# Patient Record
Sex: Female | Born: 1950 | Race: Black or African American | Hispanic: No | Marital: Married | Smoking: Never smoker
Health system: Southern US, Community
[De-identification: ages and names within clinical notes are randomized; demographics above are authoritative.]

## PROBLEM LIST (undated history)

## (undated) DIAGNOSIS — R609 Edema, unspecified: Secondary | ICD-10-CM

## (undated) DIAGNOSIS — E559 Vitamin D deficiency, unspecified: Secondary | ICD-10-CM

## (undated) DIAGNOSIS — R0600 Dyspnea, unspecified: Secondary | ICD-10-CM

## (undated) DIAGNOSIS — N189 Chronic kidney disease, unspecified: Secondary | ICD-10-CM

## (undated) DIAGNOSIS — J45909 Unspecified asthma, uncomplicated: Secondary | ICD-10-CM

## (undated) DIAGNOSIS — M329 Systemic lupus erythematosus, unspecified: Secondary | ICD-10-CM

## (undated) DIAGNOSIS — T7840XA Allergy, unspecified, initial encounter: Secondary | ICD-10-CM

## (undated) DIAGNOSIS — I10 Essential (primary) hypertension: Secondary | ICD-10-CM

## (undated) DIAGNOSIS — R7303 Prediabetes: Secondary | ICD-10-CM

## (undated) DIAGNOSIS — L509 Urticaria, unspecified: Secondary | ICD-10-CM

## (undated) DIAGNOSIS — E785 Hyperlipidemia, unspecified: Secondary | ICD-10-CM

## (undated) DIAGNOSIS — L309 Dermatitis, unspecified: Secondary | ICD-10-CM

## (undated) HISTORY — DX: Systemic lupus erythematosus, unspecified: M32.9

## (undated) HISTORY — DX: Unspecified asthma, uncomplicated: J45.909

## (undated) HISTORY — DX: Essential (primary) hypertension: I10

## (undated) HISTORY — DX: Vitamin D deficiency, unspecified: E55.9

## (undated) HISTORY — DX: Edema, unspecified: R60.9

## (undated) HISTORY — DX: Hyperlipidemia, unspecified: E78.5

## (undated) HISTORY — DX: Dermatitis, unspecified: L30.9

## (undated) HISTORY — DX: Allergy, unspecified, initial encounter: T78.40XA

## (undated) HISTORY — DX: Urticaria, unspecified: L50.9

## (undated) HISTORY — PX: ESOPHAGOGASTRODUODENOSCOPY: SHX1529

## (undated) HISTORY — PX: COLONOSCOPY: SHX174

---

## 1999-04-07 ENCOUNTER — Encounter: Payer: Self-pay | Admitting: Family Medicine

## 1999-04-07 ENCOUNTER — Ambulatory Visit (HOSPITAL_COMMUNITY): Admission: RE | Admit: 1999-04-07 | Discharge: 1999-04-07 | Payer: Self-pay | Admitting: Family Medicine

## 2000-07-09 ENCOUNTER — Inpatient Hospital Stay (HOSPITAL_COMMUNITY): Admission: AD | Admit: 2000-07-09 | Discharge: 2000-07-11 | Payer: Self-pay | Admitting: Internal Medicine

## 2000-08-16 ENCOUNTER — Ambulatory Visit (HOSPITAL_COMMUNITY): Admission: RE | Admit: 2000-08-16 | Discharge: 2000-08-16 | Payer: Self-pay | Admitting: *Deleted

## 2000-08-16 ENCOUNTER — Encounter: Payer: Self-pay | Admitting: Internal Medicine

## 2001-06-13 ENCOUNTER — Encounter: Admission: RE | Admit: 2001-06-13 | Discharge: 2001-06-13 | Payer: Self-pay | Admitting: Internal Medicine

## 2001-06-13 ENCOUNTER — Encounter: Payer: Self-pay | Admitting: Internal Medicine

## 2001-06-25 ENCOUNTER — Encounter: Admission: RE | Admit: 2001-06-25 | Discharge: 2001-07-25 | Payer: Self-pay | Admitting: Internal Medicine

## 2001-07-23 ENCOUNTER — Other Ambulatory Visit: Admission: RE | Admit: 2001-07-23 | Discharge: 2001-07-23 | Payer: Self-pay | Admitting: Gynecology

## 2002-02-25 ENCOUNTER — Other Ambulatory Visit: Admission: RE | Admit: 2002-02-25 | Discharge: 2002-02-25 | Payer: Self-pay | Admitting: Gynecology

## 2003-01-04 ENCOUNTER — Ambulatory Visit (HOSPITAL_BASED_OUTPATIENT_CLINIC_OR_DEPARTMENT_OTHER): Admission: RE | Admit: 2003-01-04 | Discharge: 2003-01-04 | Payer: Self-pay | Admitting: *Deleted

## 2003-01-20 ENCOUNTER — Encounter: Admission: RE | Admit: 2003-01-20 | Discharge: 2003-04-20 | Payer: Self-pay | Admitting: Neurology

## 2003-05-25 ENCOUNTER — Encounter: Admission: RE | Admit: 2003-05-25 | Discharge: 2003-06-22 | Payer: Self-pay | Admitting: Neurology

## 2003-07-12 ENCOUNTER — Encounter: Admission: RE | Admit: 2003-07-12 | Discharge: 2003-07-12 | Payer: Self-pay | Admitting: Neurology

## 2003-07-12 ENCOUNTER — Encounter: Payer: Self-pay | Admitting: Neurology

## 2003-07-29 ENCOUNTER — Encounter: Admission: RE | Admit: 2003-07-29 | Discharge: 2003-08-31 | Payer: Self-pay | Admitting: Neurology

## 2005-04-02 ENCOUNTER — Inpatient Hospital Stay (HOSPITAL_COMMUNITY): Admission: AD | Admit: 2005-04-02 | Discharge: 2005-04-05 | Payer: Self-pay | Admitting: Internal Medicine

## 2007-07-16 ENCOUNTER — Encounter: Admission: RE | Admit: 2007-07-16 | Discharge: 2007-07-16 | Payer: Self-pay | Admitting: Internal Medicine

## 2011-01-09 ENCOUNTER — Ambulatory Visit: Payer: Self-pay | Admitting: Genetic Counselor

## 2011-04-13 ENCOUNTER — Encounter: Payer: Self-pay | Admitting: Internal Medicine

## 2011-05-18 NOTE — Discharge Summary (Signed)
NAME:  Tracie Barnes, BERGEMAN NO.:  [id number]   MEDICAL RECORD NO.:  [id number]          PATIENT TYPE:  INP   LOCATION:  3033                         FACILITY:  [facility]   PHYSICIAN:  Eric L. August Saucer, M.D.     DATE OF BIRTH:  Oct 21, 1951   DATE OF ADMISSION:  04/02/2005  DATE OF DISCHARGE:  04/05/2005                                 DISCHARGE SUMMARY   FINAL DIAGNOSES:  1.  Cellulitis of the face, 682.0.  2.  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  3.  Myalgia and myositis, 739.1.  4.  Hypertension, 401.9.  5.  Spasms muscles, 728.85.   PROCEDURE:  None.   HISTORY OF PRESENT ILLNESS:  This was one of several [facility] admissions for this 60 year old married black female with  longstanding history of fibromyalgias and facial rash described as lupus  profundus. The patient presented to the office with increasing facial  swelling of several days duration. She had mild swelling of the face  approximately two weeks prior to admission which was felt to be related to  her medications. The patient was evaluated at the [facility]. She  did undergo some change of a medication at that time as well. She was placed  on a Tizanidine and placed on nortriptyline. Approximately 10 days prior to  admission, she noted some swelling of the right side of her face. This  gradually became more painful with subsequent redness and increasing warmth  as well. Her symptoms persisted. She developed nodules changes on the right  forehead as well. She was seen in our office for further evaluation and was  subsequently admitted for treatment for progressive cellulitis.   PAST MEDICAL HISTORY:  As per admission H&P.   PHYSICAL EXAMINATION:  As per admission H&P.   HOSPITAL COURSE:  The patient was admitted for further treatment of right  facial cellulitis. She had a history of lupus profundus. There was a  question of an adverse reaction to recent medication causing this as well. A  CT  scan of the head was obtained which showed no evidence for mass-effect,  hemorrhage, or occult sinus infections. She was placed on IV Avelox. She was  seen in consultation by Dr. Karie Soda. Joseph Art of dermatology. Further  recommendations were made including the addition of a Protopic ointment to  her face and eyelids. She was advised to avoid all photosensitizing drugs as  well.   Over the subsequent days, the patient's symptoms did improve. She notably  did not spike a high fever during this time. A facial exam did demonstrate  small areas of fissures which were felt to be possible sites for secondary  to infection. After several days of IV antibiotics, she was switched to p.o.  Avelox. Her Tizanidine medication was tapered down as well.   By April 04, 2005, she was feeling much better. She did experience a  transient abdominal discomfort which resolved completely. Vital signs were  made stable as well. She was subsequently felt to be stable for discharge  for further home management.  Notably, her sedimentation rate was 8. Hemoglobin remained stable. C-  reactive protein was 0.2.   DISCHARGE MEDICATIONS:  1.  Plaquenil 200 mg p.o. b.i.d.  2.  Metoprolol 12.5 mg b.i.d.  3.  Tizanidine 2 mg t.i.d.  4.  Etodolac 200 mg b.i.d.  5.  Loratadine 10 mg daily.  6.  Flonase nasal spray daily.  7.  Avelox 400 mg daily.  8.  Protopic 0.1% cream to rash b.i.d.   DIET:  She will be maintained on a low fat diet.   ACTIVITY:  As tolerated.   FOLLOW UP:  She will be seen in our office in two weeks time for follow-up.       ELD/MEDQ  D:  06/27/2005  T:  06/28/2005  Job:  [id number]

## 2011-05-18 NOTE — Consult Note (Signed)
[facility]  Patient:    Tracie Barnes, Tracie Barnes                        MRN: [id number] Proc. Date: 07/09/00 Attending:  Genene Churn. Love, M.D.                          Consultation Report  PATIENTS ADDRESS:  [address] [place], [place] [address].  DATE OF BIRTH:  [birth year]  REASON FOR ADMISSION:  This 60 year old, right-handed, black married female from [place], [place], seen in consultation at the request of Minerva Areola L. August Saucer, M.D. for evaluation of complaints of weakness, stiffness, and muscle pain.  HISTORY OF PRESENT ILLNESS:  Possibly as late as the winter of 2000 but certainly worse following a viral illness in February of 2001, this patient has noted the onset of aching pain occurring in her thighs bilaterally, right worse than left at times occurring with exercise, at times without exercise. It is associated with lower back and shoulder pain but has a cramping and tightening quality lasting two or three minutes at a time. This has not been associated with any injuries or falls. She has noted stiffness and tightness in her knees and ankles and has noted a pain described as a burning pain occurring from underneath the skin into her shoulders and into her muscles. At times, she can see the pain "move up my legs" described as a rolling type of pain starting her knees and coming up her legs. She was evaluated in March of 2001 by Candy Sledge, M.D. At which time, her neurologic examination was normal. She had one sed rate that was elevated at 53, and she had a rheumatic factor that was less than 20, and an ANA which was negative. Her total CPKs were normal, and an EMG was not performed. She was seen by a rheumatologist who felt that she had a parvovirus B19 infection as a possible cause for her symptoms. Since in February just prior to the onset of her symptomatology, she developed a rash over her face. She was tried on  two courses of steroids which seemed to make her symptomatology worse. Otherwise, she has not tried medications for pain other than an occasional Tylenol. specifically, she has not been on any amitriptyline. There has been no history of myoglobinuria. She has no family history of neurologic disease or myopathy, and she does not drink alcohol.  CURRENT MEDICATIONS:  She does have a past history of allergies and has been on Allegra 180 mg once per day. She has also been on amantadine 100 mg one b.i.d., Vicon Forte one q.d.  PAST MEDICAL HISTORY:  She has had no significant hospitalizations, except for nasal surgery.  ALLERGIES:  She has a history of allergy to ERYTHROMYCIN.  SOCIAL HISTORY:  She does not smoke cigarettes.  PHYSICAL EXAMINATION:  GENERAL:  Well-developed, pleasant, black female in no acute distress.  VITAL SIGNS:  Blood pressure in her right and left arm of 150/80, heart rate of 64 and regular.  NECK:  There were no bruits. The neck flexion and extension maneuvers were unremarkable.  NEUROLOGICAL:  MENTAL STATUS EXAMINATION:  She was alert and oriented x 3. Her cranial nerve examination was normal. Visual fields to be full. To count finger examination. Disks were flat. Extraocular movements were full. Red lens testing was unremarkable. Pupils reactive from 5 to 3 bilaterally.  Corneals were present. Tongue was midline. The uvula was midline. Gags were present. There was no dysarthria. Motor examination revealed good strength in the upper and lower extremities with negative muscle percussion, jerking. Sensory examination was intact to pinprick, light touch, general position, and vibration testing. Deep tendon reflexes were 2+. Gait examination was unremarkable.  IMPRESSION: 1. 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 2. Pain, code 729.5. 3. Lower back pain, code 724.4.  PLAN:  At this time, is to obtain antibodies to glutamic acid decarboxylase, EMG, and  consider high dose Valium IV IG plasma free ______ after the above. DD:  07/09/00 TD:  07/09/00 Job: [id number]

## 2011-05-18 NOTE — H&P (Signed)
NAME:  Tracie Barnes, POLIMENI NO.:  [id number]   MEDICAL RECORD NO.:  [id number]          PATIENT TYPE:  INP   LOCATION:  3033                         FACILITY:  [facility]   PHYSICIAN:  Eric L. August Saucer, M.D.     DATE OF BIRTH:  July 04, 1951   DATE OF ADMISSION:  04/02/2005  DATE OF DISCHARGE:                                HISTORY & PHYSICAL   CHIEF COMPLAINT:  Increasing right facial swelling with inflammation.   HISTORY OF PRESENT ILLNESS:  This is one of several [facility] admissions for this 60 year old married black female with  longstanding history of fibromyalgia and facial rash described as lupus  profundus. The patient presented to the office with increasing facial  swelling of several days duration. She states that she had a mild swelling  of the face approximately two weeks ago which was felt to be related to her  medications. She was being evaluated at the [facility]. She did  undergo some change of her medications at that time. She also had her  Nortriptyline stopped which she had been taking chronically and started a  new medication, Tizanidine. Approximately 10 days ago, she noted some  swelling of the right side of the face. This gradually became more painful  with subsequent redness, increasing warmth as well. There was no fever or  chills or documented night sweats. Symptoms have persisted. She developed  nodular changes on the right forehead region as well.  The patient was seen  in our office today for evaluation and subsequently admitted for treatment  of cellulitis.   Past history significant for recent problems for recurring facial rash which  had been evaluated in the [facility] and felt to be a lupus profundus. She  has had several serological studies to exclude systemic lupus. She has been  seen by rheumatology, Dr. Lemmie Evens here in [place]. She has also  been evaluated by dermatology at the [facility] system. The  patient has also been  evaluated by Dr. Karie Soda. [facility] of Dermat[facility] locally approximately two  years ago. Biopsy was not consistent with systemic lupus done two years ago  as well. The patient has been followed by Dr. Genene Churn. Love for a recurrent  muscle spasm and tremor condition. Most recently, it is thought she has a  fibromyalgia for which she has had medication change. The patient has also  been treated approximately one month ago for a sinus infection which was  documented by a CT scan. She has a history of mild hypertension as well.   PAST MEDICAL HISTORY:  As noted above.   SOCIAL HISTORY:  She does not smoke or drink. She has had some situational  stressors intermittently.   ALLERGIES:  The patient is allergic to SULFA and PENICILLIN.   MEDICATIONS:  1.  Plaquenil 200 mg p.o. b.i.d.  2.  Metoprolol 12.5 mg b.i.d.  3.  Etodolac 300 mg b.i.d.  4.  Tizanidine 2 mg 2 tablets t.i.d.  5.  Diphenhydramine 25 mg p.o. b.i.d.  6.  Flonase nasal  spray 1 puff each nostril q.a.m.  7.  Loratidine 10 mg q.d.  8.  The patient had also been using triamcinolone cream 0.1% to rash b.i.d.   PHYSICAL EXAMINATION:  GENERAL:  She is a well-developed, well-nourished,  ill-appearing black female presently in no acute distress.  VITAL SIGNS:  Weight 167 pounds. Blood pressure 126/82, pulse 80,  respiratory rate 14, temperature 98.2.  HEENT:  Head is normocephalic. She had nodular lesion on the right forehead  region. This area is tender to palpation. She has marked periorbital  erythema with increased warmth and tenderness to palpation as well. The  patient has some blotchy hyperpigmentation in the right lower facial region.  Fundi show disks were flat. Nose showed mild turbinate edema. TMs without  erythematous changes.  NECK:  Supple. She has right posterior node, slightly tender. There is right  submental node appreciated as well.  LUNGS:  Clear without wheezes or rales. No E to A  changes.  CARDIOVASCULAR:  She has normal S1 and S2. No S3, S4, murmurs, or rubs.  ABDOMEN:  Without tenderness.  EXTREMITIES:  Negative Homans'. No edema.  NEUROLOGICAL:  Intact.  SKIN:  As noted above. She has scattered macular erythematous rash on her  arms with some old scarring on the forearms as well. Scattered areas of  macular erythematous changes on her chest wall as well.   LABORATORY DATA:  A CBC reveals a WBC of 6400, hemoglobin 12.8, hematocrit  of 37.4, platelets 306,000. Normal differential noted. Chemistry showed  sodium 135, potassium 3.8, chloride 99, CO2 30, BUN 8, creatinine 1.2,  glucose of 95. Total protein of 6.9. SGOT and SGPT within normal limits.  Albumin 3.8. Calcium 8.8. Other lab studies pending.   CT scan of the head shows no evidence of hemorrhage, mass or infarct.  Sinuses are clear. Bones are unremarkable.   IMPRESSION:  1.  Right facial cellulitis.  2.  Lupus profundus persistent.  3.  Fibromyalgia by history.  4.  Status post sinusitis with good resolution by most recent CT scan.  5.  Distant history of mild hypertension, presently stable.  6.  History of atypical muscle spasms, presently controlled with new      medication.  7.  Rule out adverse medication reaction.   PLAN:  We will admit the patient for further evaluation. We will place her  on IV Avelox with subsequent switching to p.o. pending a clinical response.  Dermatologic consultation has been obtained with Dr. Karie Soda. Woods. I have  reviewed the patient's presentation with Dr. Lemmie Evens. It is felt  that she has no underlying systemic rheumatologic disease. We will follow up  her course at this time. ID opinion if her symptoms persist.      ELD/MEDQ  D:  04/02/2005  T:  04/02/2005  Job:  [id number]

## 2011-12-04 LAB — HM PAP SMEAR: HM Pap smear: NEGATIVE

## 2013-01-21 ENCOUNTER — Encounter: Payer: Self-pay | Admitting: Hematology

## 2013-05-18 ENCOUNTER — Encounter: Payer: Self-pay | Admitting: Internal Medicine

## 2014-02-10 ENCOUNTER — Ambulatory Visit (INDEPENDENT_AMBULATORY_CARE_PROVIDER_SITE_OTHER): Payer: Medicare HMO | Admitting: Neurology

## 2014-02-10 ENCOUNTER — Encounter: Payer: Self-pay | Admitting: Neurology

## 2014-02-10 VITALS — BP 135/89 | HR 101 | Ht 64.75 in | Wt 144.0 lb

## 2014-02-10 DIAGNOSIS — IMO0001 Reserved for inherently not codable concepts without codable children: Secondary | ICD-10-CM

## 2014-02-10 DIAGNOSIS — M797 Fibromyalgia: Secondary | ICD-10-CM | POA: Insufficient documentation

## 2014-02-10 DIAGNOSIS — M62838 Other muscle spasm: Secondary | ICD-10-CM

## 2014-02-10 NOTE — Progress Notes (Signed)
PATIENT: Tracie Barnes DOB: 01/07/1951  HISTORICAL  Dion Parrow is a 63 years old right-handed African American female, last clinical visit was February 2014  She was previously patients of Dr. love, had a past medical history of lupus, carpal tunnel, neck pain, fibromyalgia, she is taking nortriptyline for muscle spasticity, and lower extremity pain she gets all her medication through [place],, Her lupus is under good control,  Over the years, she has been combating episodes of sudden onset shortness of breath, difficulty breathing, difficulty talking, she was put on home oxygen as needed by Dr. Sandria Manly since 2001, which has been very effective, when she sense that her laryngeal muscle is going to spasm again, she takes a few deep breathe of oxygen, it is usually abort the episode within 30 minutes. She also has difficulty talking during the episodes. The trigger for those difficulty breathing talking episodes of exertion, anxiety.    she is now complaining of two-month history of right knee pain, bilateral lower extremity muscle spasm, achy pain,  REVIEW OF SYSTEMS: Full 14 system review of systems performed and notable only for right knee pain, swelling, back pain, achy muscles, muscle cramps, tremors  ALLERGIES: Allergies  Allergen Reactions  . Ampicillin   . Prednisone   . Sulfa Antibiotics     HOME MEDICATIONS: Current Outpatient Prescriptions on File Prior to Visit  Medication Sig Dispense Refill  . aspirin 325 MG tablet Take 325 mg by mouth daily.        . cholecalciferol (VITAMIN D) 400 UNITS TABS Take 400 Units by mouth daily.      Marland Kitchen loratadine (ALLERGY) 10 MG tablet Take 10 mg by mouth daily.        . metoprolol (LOPRESSOR) 50 MG tablet Take 25 mg by mouth daily.      Marland Kitchen METOPROLOL TARTRATE PO Take 50 mg by mouth.       . NORTRIPTYLINE HCL PO Take 10 mg by mouth 2 (two) times daily in the am and at bedtime.. 1 tab in am, 3 tabs at HS      . Omega-3 Fatty Acids (FISH OIL) 1000 MG  CAPS Take 1 capsule by mouth daily.        Marland Kitchen omeprazole (PRILOSEC) 20 MG capsule Take 20 mg by mouth 2 (two) times daily.      Marland Kitchen triamterene-hydrochlorothiazide (MAXZIDE-25) 37.5-25 MG per tablet Take 0.5 tablets by mouth daily.      . vitamin E (VITAMIN E) 400 UNIT capsule Take 400 Units by mouth daily.         No current facility-administered medications on file prior to visit.    PAST MEDICAL HISTORY: Past Medical History  Diagnosis Date  . Hypertension   . Allergy   . Hyperlipidemia   . Unspecified vitamin D deficiency   . Systemic lupus erythematosus   . Fluid retention     PAST SURGICAL HISTORY: No past surgical history on file.  FAMILY HISTORY: No family history on file.  SOCIAL HISTORY:  History   Social History  . Marital Status: Married    Spouse Name: N/A    Number of Children: N/A  . Years of Education: N/A   Occupational History  . Not on file.   Social History Main Topics  . Smoking status: Never Smoker   . Smokeless tobacco: Never Used  . Alcohol Use: No  . Drug Use: No  . Sexual Activity: Not on file   Other Topics Concern  . Not  on file   Social History Narrative   Patient is single, has 3 children   Patient is right handed   Education level is Master's degree   Caffeine consumption is 0     PHYSICAL EXAM   Filed Vitals:   02/10/14 1131  BP: 135/89  Pulse: 101  Height: 5' 4.75" (1.645 m)  Weight: 144 lb (65.318 kg)    Not recorded    Body mass index is 24.14 kg/(m^2).   Generalized: In no acute distress  Neck: Supple, no carotid bruits   Cardiac: Regular rate rhythm  Pulmonary: Clear to auscultation bilaterally  Musculoskeletal: No deformity  Neurological examination  Mentation: Alert oriented to time, place, history taking, and causual conversation  Cranial nerve II-XII: Pupils were equal round reactive to light. Extraocular movements were full.  Visual field were full on confrontational test. Bilateral fundi were  sharp.  Facial sensation and strength were normal. Hearing was intact to finger rubbing bilaterally. Uvula tongue midline.  Head turning and shoulder shrug and were normal and symmetric.Tongue protrusion into cheek strength was normal.  Motor: Normal tone, bulk and strength.  Sensory: Intact to fine touch, pinprick, preserved vibratory sensation, and proprioception at toes.  Coordination: Normal finger to nose, heel-to-shin bilaterally there was no truncal ataxia  Gait: Rising up from seated position without assistance, normal stance, without trunk ataxia, moderate stride, good arm swing, smooth turning, able to perform tiptoe, and heel walking without difficulty.   Romberg signs: Negative  Deep tendon reflexes: Brachioradialis 2/2, biceps 2/2, triceps 2/2, patellar 2/2, Achilles 2/2, plantar responses were flexor bilaterally.   DIAGNOSTIC DATA (LABS, IMAGING, TESTING) - I reviewed patient records, labs, notes, testing and imaging myself where available.  ASSESSMENT AND PLAN  Tracie Barnes is a 63 y.o. female  with recurrent episode of laryngeal muscle spasm, responding very well to low flow oxygen treatment, she is getting rest of the medication management [facility], our office is only refill her oxygen request She is to return to clinic in one year with Gerlene Feearolyn      Nissan Frazzini, M.D. Ph.D.  [facility] [address], [address] [place], [place] [address] [phone]

## 2014-11-23 ENCOUNTER — Encounter: Payer: Self-pay | Admitting: Neurology

## 2015-02-10 ENCOUNTER — Ambulatory Visit: Payer: Medicare HMO | Admitting: Nurse Practitioner

## 2015-02-11 ENCOUNTER — Encounter: Payer: Self-pay | Admitting: Nurse Practitioner

## 2016-05-30 ENCOUNTER — Telehealth: Payer: Self-pay | Admitting: *Deleted

## 2016-05-30 ENCOUNTER — Ambulatory Visit (INDEPENDENT_AMBULATORY_CARE_PROVIDER_SITE_OTHER): Payer: Medicare HMO | Admitting: Nurse Practitioner

## 2016-05-30 ENCOUNTER — Encounter: Payer: Self-pay | Admitting: *Deleted

## 2016-05-30 ENCOUNTER — Encounter: Payer: Self-pay | Admitting: Nurse Practitioner

## 2016-05-30 VITALS — BP 118/82 | HR 92 | Ht 64.75 in | Wt 155.8 lb

## 2016-05-30 DIAGNOSIS — R1310 Dysphagia, unspecified: Secondary | ICD-10-CM | POA: Insufficient documentation

## 2016-05-30 DIAGNOSIS — M797 Fibromyalgia: Secondary | ICD-10-CM | POA: Diagnosis not present

## 2016-05-30 DIAGNOSIS — R0989 Other specified symptoms and signs involving the circulatory and respiratory systems: Secondary | ICD-10-CM | POA: Insufficient documentation

## 2016-05-30 DIAGNOSIS — M62838 Other muscle spasm: Secondary | ICD-10-CM

## 2016-05-30 DIAGNOSIS — R0689 Other abnormalities of breathing: Secondary | ICD-10-CM | POA: Diagnosis not present

## 2016-05-30 NOTE — Patient Instructions (Signed)
Handicap  Sticker signed Will get MRI of the brain,  need to be referred pulmonologist Follow-up when necessary

## 2016-05-30 NOTE — Telephone Encounter (Signed)
Awaiting for pt to return call [facility].  Also the oxygen for laryngospasm should be addressed by pulmonary as well per CM/NP

## 2016-05-30 NOTE — Progress Notes (Signed)
[facility]  PATIENT: Tracie Barnes DOB: [birth year]   REASON FOR VISIT: Follow-up for laryngeal muscle spasms, fibromyalgia , choking sensations difficulty swallowing shortness of breath HISTORY FROM: Patient    HISTORY OF PRESENT ILLNESS:UPDATE 05/31/2017CM Tracie Barnes, 65 year old female returns for follow-up in last clinical visit February 2015. She has multiple complaints of voice changes choking loss of breath swollen face to include the right ear twitches. In addition she says that when [place] was in [employer] [phone] she was exposed to  mercury vapors and amalgram dust. She states she mixed these with her bare hands and no mask or  Gloves. She worked as a Sales executivedental assistant She has her medical care through the [facility] except she has gotten oxygen through this office in the past. She has never had a pulmonary evaluation. She has never had an MRI of the brain. She has a history of lupus which has been stable. She also complains of overall weakness and fatigue. She returns for reevaluation    HISTORY 02/10/14 Tracie Barnes is a [age] years old right-handed African American female, last clinical visit was February 2014  She was previously patients of Dr. love, had a past medical history of lupus, carpal tunnel, neck pain, fibromyalgia, she is taking nortriptyline for muscle spasticity, and lower extremity pain she gets all her medication through [facility],, Her lupus is under good control,  Over the years, she has been combating episodes of sudden onset shortness of breath, difficulty breathing, difficulty talking, she was put on home oxygen as needed by Dr. Sandria ManlyLove since 2001, which has been very effective, when she sense that her laryngeal muscle is going to spasm again, she takes a few deep breathe of oxygen, it is usually abort the episode within 30 minutes. She also has difficulty talking during the episodes. The trigger for those difficulty breathing talking episodes of exertion,  anxiety.   she is now complaining of two-month history of right knee pain, bilateral lower extremity muscle spasm, achy pain,   REVIEW OF SYSTEMS: Full 14 system review of systems performed and notable only for those listed, all others are neg:  Constitutional: Fatigue  Cardiovascular: neg Ear/Nose/Throat: Trouble swallowing  Skin: neg Eyes: neg Respiratory: Chronic cough wheezing Gastroitestinal: Constipation Hematology/Lymphatic: neg  Endocrine: neg Musculoskeletal: Joint pain aching muscles muscle cramps Allergy/Immunology: neg Neurological: Weakness and tremors Psychiatric: neg Sleep : neg   ALLERGIES: Allergies  Allergen Reactions  . Ampicillin   . Prednisone   . Sulfa Antibiotics     HOME MEDICATIONS: Outpatient Prescriptions Prior to Visit  Medication Sig Dispense Refill  . budesonide-formoterol (SYMBICORT) 80-4.5 MCG/ACT inhaler Inhale 2 puffs into the lungs 2 (two) times daily.    . flunisolide (NASALIDE) 25 MCG/ACT (0.025%) SOLN Place 2 sprays into the nose 2 (two) times daily.    . fluticasone (FLONASE) 50 MCG/ACT nasal spray Place into both nostrils 2 (two) times daily.    . folic acid (FOLVITE) 1 MG tablet Take 1 mg by mouth daily.    . hydroxychloroquine (PLAQUENIL) 200 MG tablet Take by mouth 2 (two) times daily.    . hyoscyamine (LEVSIN) 0.125 MG/5ML ELIX Take 0.125 mg by mouth.    Marland Kitchen. ipratropium (ATROVENT) 0.03 % nasal spray Place 2 sprays into both nostrils daily.    Marland Kitchen. ketotifen (ZADITOR) 0.025 % ophthalmic solution Place 1 drop into both eyes daily.    Marland Kitchen. loratadine (ALLERGY) 10 MG tablet Take 10 mg by mouth daily.      .Marland Kitchen  Menthol-Methyl Salicylate (THERA-GESIC) 0.5-15 % CREA Apply topically.    . methotrexate (RHEUMATREX) 5 MG tablet Take 5 mg by mouth once a week. Caution: Chemotherapy. Protect from light.    Marland Kitchen METOPROLOL TARTRATE PO Take 12.5 mg by mouth.     . montelukast (SINGULAIR) 10 MG tablet Take 10 mg by mouth at bedtime.    Marland Kitchen NORTRIPTYLINE HCL  PO Take 25 mg by mouth 2 (two) times daily in the am and at bedtime.. 1 tab in am, 3 tabs at HS    . Omega-3 Fatty Acids (FISH OIL) 1000 MG CAPS Take 1 capsule by mouth daily.      Marland Kitchen omeprazole (PRILOSEC) 20 MG capsule Take 20 mg by mouth 2 (two) times daily.    Marland Kitchen triamterene-hydrochlorothiazide (MAXZIDE-25) 37.5-25 MG per tablet Take 0.5 tablets by mouth daily.    . vitamin E 1000 UNIT capsule Take 1,000 Units by mouth daily.    Marland Kitchen aspirin 325 MG tablet Take 325 mg by mouth daily.      . cholecalciferol (VITAMIN D) 400 UNITS TABS Take 400 Units by mouth daily.    . methotrexate 2.5 MG tablet Take 2.5 mg by mouth 3 (three) times a week.    . metoprolol (LOPRESSOR) 50 MG tablet Take 25 mg by mouth daily.    . vitamin E (VITAMIN E) 400 UNIT capsule Take 400 Units by mouth daily.       No facility-administered medications prior to visit.    PAST MEDICAL HISTORY: Past Medical History  Diagnosis Date  . Hypertension   . Allergy   . Hyperlipidemia   . Unspecified vitamin D deficiency   . Systemic lupus erythematosus (HCC)   . Fluid retention     PAST SURGICAL HISTORY: History reviewed. No pertinent past surgical history.  FAMILY HISTORY: History reviewed. No pertinent family history.  SOCIAL HISTORY: Social History   Social History  . Marital Status: Married    Spouse Name: N/A  . Number of Children: N/A  . Years of Education: N/A   Occupational History  . Not on file.   Social History Main Topics  . Smoking status: Never Smoker   . Smokeless tobacco: Never Used  . Alcohol Use: No  . Drug Use: No  . Sexual Activity: Not on file   Other Topics Concern  . Not on file   Social History Narrative   Patient is single, has 3 children   Patient is right handed   Education level is Master's degree   Caffeine consumption is 0     PHYSICAL EXAM  Filed Vitals:   05/30/16 1502  BP: 118/82  Pulse: 92  Height: 5' 4.75" (1.645 m)  Weight: 155 lb 12.8 oz (70.67 kg)    Body mass index is 26.12 kg/(m^2). Generalized: In no acute distress Neck: Supple, no carotid bruits  Cardiac: Regular rate rhythm Musculoskeletal: No deformity  Neurological examination  Mentation: Alert oriented to time, place, history taking, and causual conversation Cranial nerve II-XII: Pupils were equal round reactive to light. Extraocular movements were full. Visual field were full on confrontational test. Bilateral fundi were sharp. Facial sensation and strength were normal. Hearing was intact to finger rubbing bilaterally. Uvula tongue midline. Head turning and shoulder shrug and were normal and symmetric.Tongue protrusion into cheek strength was normal. Motor: Normal tone, bulk and strength. Sensory: Intact to fine touch, pinprick, preserved vibratory sensation, and proprioception at toes. Coordination: Normal finger to nose, heel-to-shin bilaterally there was no truncal ataxia  Gait: Rising up from seated position without assistance, normal stance, without trunk ataxia, moderate stride, good arm swing, smooth turning, able to perform tiptoe, and heel walking without difficulty.  Romberg signs: Negative Deep tendon reflexes: Brachioradialis 2/2, biceps 2/2, triceps 2/2, patellar 2/2, Achilles 2/2, plantar responses were flexor bilaterally.  DIAGNOSTIC DATA (LABS, IMAGING, TESTING) - ASSESSMENT AND PLAN 65 y.o. female with recurrent episode of laryngeal muscle spasm, has responded well very well to low flow oxygen treatment, but no oxygen in several years.She is getting rest of the medication management through [place], our office is only refill her oxygen request in the past . Today she has multiple complaints of voice changes, choking loss of breath swollen face to include the right ear twitches. In addition she says that when she was in [employer] [phone] she was exposed to  mercury vapors and amalgram dust. She states she mixed these with her bare hands and no mask or   Gloves. She has never seen a pulmonologist  Discussed with Dr. Terrace Arabia Handicap  Sticker signed Will get MRI of the brain,  need to be referred pulmonologist patient call back with the name of a pulmonologist at the [facility] Follow-up when necessary Multiple questions answered Vst time 45 min Nilda Riggs, [facility], [facility], APRN  [facility] [address] [place], [place] [address] [phone]

## 2016-05-31 NOTE — Progress Notes (Signed)
I have reviewed and agreed above plan. 

## 2016-06-01 ENCOUNTER — Telehealth: Payer: Self-pay | Admitting: Nurse Practitioner

## 2016-06-01 NOTE — Telephone Encounter (Signed)
Rn call patient back about her nortriptyline sent to the [facility] with paper prescription. Pt ask about her oxygen rx. Rn stated per Carolyn(NP) she needs to find a pulmonologist for her respiratory issues. Pt has not been here in two years,and was old patient of Dr. Sandria ManlyLove. Rn stated she needs to find a pulmonologist so Carolyn(NP0 can send the referral. PT stated she will have to call her insurance company to get the names of some.

## 2016-06-01 NOTE — Telephone Encounter (Signed)
See phone note from 06/01/2016.

## 2016-06-01 NOTE — Telephone Encounter (Signed)
Rn talk to [name] about patients nortriptyline. Pt has not been here in two years, and needs to contact the Md who has been refilling the meds.

## 2016-06-01 NOTE — Telephone Encounter (Signed)
RN call patient back about the referral for pulmonologist and nortriptyline medication. Rn ask patient did she have the name for a pulmonologist so the referral can be done. Rn also ask patient about who has been prescribing her nortriptyline for the past two years. Patient stated " [facility] for the past two years". Rn stated per Carolyn(NP) she would need to seek refills from that [facility] MD. Also patient stated " The [facility] and allergy doctors have check out my heart, lungs, and they are taking care of that"." So I dont need to see a pulmonologist. Rn clarified with patient that she did not want referral for the pulmonologist. "Patient stated I dont want the referral, the [facility] have check me out and can take care of that". Pt stated ' I just want my brain check out like a MR of the brain.. Rn explain the MR of brain was order, and she will get a call from [facility] to schedule. Pt verbalized understanding. [facility] and [facility].Yan about the patient.

## 2016-06-01 NOTE — Telephone Encounter (Signed)
noted 

## 2016-06-01 NOTE — Telephone Encounter (Signed)
Pt called in and is requesting her rx's NORTRIPTYLINE HCL PO be wrote for her to pick up and take to [facility] herself. She will be calling her insurance to decide where her o2 rx will need to go.

## 2016-06-10 ENCOUNTER — Inpatient Hospital Stay
Admission: RE | Admit: 2016-06-10 | Discharge: 2016-06-10 | Disposition: A | Discharge status: Home / Self Care | Payer: Self-pay | Source: Ambulatory Visit | Attending: Nurse Practitioner | Admitting: Nurse Practitioner

## 2016-06-10 DIAGNOSIS — R1310 Dysphagia, unspecified: Secondary | ICD-10-CM

## 2016-06-10 DIAGNOSIS — R0989 Other specified symptoms and signs involving the circulatory and respiratory systems: Secondary | ICD-10-CM

## 2016-06-10 DIAGNOSIS — R0689 Other abnormalities of breathing: Principal | ICD-10-CM

## 2016-06-11 ENCOUNTER — Telehealth: Payer: Self-pay | Admitting: *Deleted

## 2016-06-11 NOTE — Telephone Encounter (Signed)
[facility]. Thanks Annabelle Harmanana

## 2016-06-11 NOTE — Telephone Encounter (Signed)
Sent to Dana in referrals. 

## 2016-06-11 NOTE — Telephone Encounter (Signed)
Pt called, her brain scan was cxl because of insurance reason. [facility] Please advise [phone]

## 2016-06-14 NOTE — Telephone Encounter (Signed)
Patient called to advise, [facility] Has agreed  To do the MRI (brain scan) and they need the order. Patient requests to send document (order) as attachment in a e-mail to her and she will send to PCP/Dr. Terrilee FilesSalman.

## 2016-06-27 NOTE — Telephone Encounter (Signed)
Returned patients call x2 I will need a fax number to send the order to. Thanks!

## 2016-09-21 DIAGNOSIS — L930 Discoid lupus erythematosus: Secondary | ICD-10-CM | POA: Insufficient documentation

## 2017-01-21 ENCOUNTER — Emergency Department (HOSPITAL_COMMUNITY): Payer: Medicare PPO

## 2017-01-21 ENCOUNTER — Encounter (HOSPITAL_COMMUNITY): Payer: Self-pay | Admitting: Emergency Medicine

## 2017-01-21 ENCOUNTER — Emergency Department (HOSPITAL_COMMUNITY)
Admission: EM | Admit: 2017-01-21 | Discharge: 2017-01-22 | Disposition: A | Discharge status: Home / Self Care | Payer: Medicare PPO | Attending: Emergency Medicine | Admitting: Emergency Medicine

## 2017-01-21 DIAGNOSIS — I10 Essential (primary) hypertension: Secondary | ICD-10-CM | POA: Diagnosis not present

## 2017-01-21 DIAGNOSIS — R0602 Shortness of breath: Secondary | ICD-10-CM

## 2017-01-21 DIAGNOSIS — Z7982 Long term (current) use of aspirin: Secondary | ICD-10-CM | POA: Diagnosis not present

## 2017-01-21 DIAGNOSIS — Z79899 Other long term (current) drug therapy: Secondary | ICD-10-CM | POA: Diagnosis not present

## 2017-01-21 NOTE — ED Triage Notes (Signed)
With triage pt states past prescription for oxygen but not at present time. Pt verbalizes placed oxygen related to SOB. Pt currently being r/o for ALS and Bulbar Palsy. Pt verbalizes "cannot get enough air to talk or breath; all my muscles lock up." Pt placed on RA at present time. Oxygen saturation 100% on RA.

## 2017-01-21 NOTE — ED Triage Notes (Signed)
Per EMS pt complaint of sudden onset SOB 15 minutes prior to EMS arrival. Pt on 5 lpm [place] at home and oxygen saturation did not read with at home oxygen use. NRB placed by EMS en route; albuterol not given related to pt writing "bad reaction to albuterol."

## 2017-01-21 NOTE — ED Notes (Signed)
Pt to wait in lobby per charge, Lynnsey. Pt aware IV saline locked and must be removed if leaving department.  

## 2017-01-21 NOTE — ED Provider Notes (Signed)
[facility]-EMERGENCY DEPT Provider Note   CSN: [id number] Arrival date & time: 01/21/17  1204 By signing my name below, I, Bridgette Habermann, attest that this documentation has been prepared under the direction and in the presence of Tomasita Crumble, MD. Electronically Signed: Bridgette Habermann, ED Scribe. 01/21/17. 11:20 PM.  History   Chief Complaint Chief Complaint  Patient presents with  . Shortness of Breath   HPI The history is provided by the patient. No language interpreter was used.   HPI Comments: Tracie Barnes is a 65 y.o. female with h/o HLD and HTN, who presents to the Emergency Department by EMS complaining of sudden onset, intermittent, shortness of breath and difficulty swallowing onset ~10:30 am this morning. Pt describes her episode as her "muscles around [her] throat locking up" where she "cannot get enough air to talk or breathe". Pt notes that these choking / shortness of breath episodes happen frequently, sometimes during her sleep. 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 This first started in 1973 and she has seen several neurologist without any diagnosis or being placed on any medications. She has self-diagnosed.  Denies recent illness. Denies fever.  Past Medical History:  Diagnosis Date  . Allergy   . Fluid retention   . Hyperlipidemia   . Hypertension   . Systemic lupus erythematosus (HCC)   . Unspecified vitamin D deficiency     Patient Active Problem List   Diagnosis Date Noted  . Swallowing difficulty 05/30/2016  . Choking episode occurring both during daytime and at night 05/30/2016  . Muscle spasm 02/10/2014  . Fibromyalgia 02/10/2014    History reviewed. No pertinent surgical history.  OB History    No data available       Home Medications    Prior to Admission medications   Medication Sig Start Date End Date Taking? Authorizing Provider  ACIDOPHILUS LACTOBACILLUS PO Take 2  tablets by mouth daily.   Yes Historical Provider, MD  aspirin 81 MG tablet Take 81 mg by mouth daily.   Yes Historical Provider, MD  Biotin 78295 MCG TABS Take 2,000 mcg by mouth daily.   Yes Historical Provider, MD  Cholecalciferol 10000 units TABS Take 1,000 Units by mouth daily.   Yes Historical Provider, MD  dicyclomine (BENTYL) 10 MG capsule Take 10 mg by mouth 4 (four) times daily -  before meals and at bedtime.   Yes Historical Provider, MD  docusate sodium (COLACE) 100 MG capsule Take 100 mg by mouth 2 (two) times daily.   Yes Historical Provider, MD  folic acid (FOLVITE) 1 MG tablet Take 1 mg by mouth daily.   Yes Historical Provider, MD  guaifenesin (HUMIBID E) 400 MG TABS tablet Take 400 mg by mouth 2 (two) times daily.   Yes Historical Provider, MD  hydroxychloroquine (PLAQUENIL) 200 MG tablet Take by mouth 2 (two) times daily.   Yes Historical Provider, MD  loratadine (ALLERGY) 10 MG tablet Take 10 mg by mouth daily.     Yes Historical Provider, MD  methotrexate 2.5 MG tablet Take 12.5 mg by mouth once a week. Caution: Chemotherapy. Protect from light.    Yes Historical Provider, MD  metoprolol (LOPRESSOR) 50 MG tablet Take 12.5 mg by mouth.    Yes Historical Provider, MD  montelukast (SINGULAIR) 10 MG tablet Take 10 mg by mouth at bedtime.   Yes Historical Provider, MD  NORTRIPTYLINE HCL PO Take 25 mg  by mouth 2 (two) times daily in the am and at bedtime.. 1 tab in am, 3 tabs at HS   Yes Historical Provider, MD  Omega-3 Fatty Acids (FISH OIL) 1000 MG CAPS Take 1 capsule by mouth daily.     Yes Historical Provider, MD  omeprazole (PRILOSEC) 20 MG capsule Take 20 mg by mouth 2 (two) times daily.   Yes Historical Provider, MD  ranitidine (ZANTAC) 300 MG capsule Take 300 mg by mouth every evening.   Yes Historical Provider, MD  triamterene-hydrochlorothiazide (MAXZIDE-25) 37.5-25 MG per tablet Take 0.5 tablets by mouth daily.   Yes Historical Provider, MD  vitamin E 400 UNIT capsule Take  800 Units by mouth daily.    Yes Historical Provider, MD    Family History No family history on file.  Social History Social History  Substance Use Topics  . Smoking status: Never Smoker  . Smokeless tobacco: Never Used  . Alcohol use No     Allergies   Ampicillin; Prednisone; and Sulfa antibiotics   Review of Systems Review of Systems  Constitutional: Negative for chills and fever.  HENT: Positive for trouble swallowing.   Respiratory: Positive for shortness of breath.   All other systems reviewed and are negative.    Physical Exam Updated Vital Signs BP 139/89 (BP Location: Left Arm)   Pulse 102   Temp 98 F (36.7 C) (Oral)   Resp 18   Ht 5\' 6"  (1.676 m)   Wt 142 lb (64.4 kg)   SpO2 100%   BMI 22.92 kg/m   Physical Exam  Constitutional: She is oriented to person, place, and time. She appears well-developed and well-nourished. No distress.  HENT:  Head: Normocephalic and atraumatic.  Nose: Nose normal.  Mouth/Throat: Oropharynx is clear and moist. No oropharyngeal exudate.  Eyes: Conjunctivae and EOM are normal. Pupils are equal, round, and reactive to light. No scleral icterus.  Neck: Normal range of motion. Neck supple. No JVD present. No tracheal deviation present. No thyromegaly present.  Cardiovascular: Normal rate, regular rhythm and normal heart sounds.  Exam reveals no gallop and no friction rub.   No murmur heard. Pulmonary/Chest: Effort normal and breath sounds normal. No respiratory distress. She has no wheezes. She exhibits no tenderness.  Abdominal: Soft. Bowel sounds are normal. She exhibits no distension and no mass. There is no tenderness. There is no rebound and no guarding.  Musculoskeletal: Normal range of motion. She exhibits no edema or tenderness.  Lymphadenopathy:    She has no cervical adenopathy.  Neurological: She is alert and oriented to person, place, and time. No cranial nerve deficit. She exhibits normal muscle tone.  Skin: Skin  is warm and dry. No rash noted. No erythema. No pallor.  Nursing note and vitals reviewed.    ED Treatments / Results  DIAGNOSTIC STUDIES: Oxygen Saturation is 100% on RA, normal by my interpretation.    COORDINATION OF CARE: 11:13 PM Discussed treatment plan with pt at bedside and pt agreed to plan.  Labs (all labs ordered are listed, but only abnormal results are displayed) Labs Reviewed - No data to display  EKG  EKG Interpretation  Date/Time:  Monday January 21 2017 12:42:29 EST Ventricular Rate:  104 PR Interval:    QRS Duration: 81 QT Interval:  348 QTC Calculation: 458 R Axis:   71 Text Interpretation:  Sinus tachycardia No old tracing to compare Confirmed by Erroll Luna [phone]) on 01/21/2017 11:21:37 PM  Radiology Dg Chest 2 View  Result Date: 01/21/2017 CLINICAL DATA:  66 year old female with a history of shortness of breath EXAM: CHEST  2 VIEW COMPARISON:  07/16/2007 FINDINGS: Cardiomediastinal silhouette is unchanged in size and contour. No central vascular congestion. No pneumothorax. No pleural effusion. No confluent airspace disease. No displaced fracture. Mild degenerative changes of the spine. IMPRESSION: No radiographic evidence of acute cardiopulmonary disease Signed, Yvone NeuJaime S. Loreta AveWagner, DO [facility] Electronically Signed   By: Gilmer MorJaime  Wagner D.O.   On: 01/21/2017 13:09    Procedures Procedures (including critical care time)  Medications Ordered in ED Medications - No data to display   Initial Impression / Assessment and Plan / ED Course  I have reviewed the triage vital signs and the nursing notes.  Pertinent labs & imaging results that were available during my care of the patient were reviewed by me and considered in my medical decision making (see chart for details).     Patient presents to the ED for SOB.  This occurred over 12 hours ago and has since resolved without  returning. She currently feel back to her normal baseline.  Tachycardia has resolved on my examination.  CXR and EKG are unremarkable.  She appears well and in NAD.  Neurology referral was offered to her.  VS remain within her normal limits and she is safe for DC.  Final Clinical Impressions(s) / ED Diagnoses   Final diagnoses:  Shortness of breath    New Prescriptions New Prescriptions   No medications on file   I personally performed the services described in this documentation, which was scribed in my presence. The recorded information has been reviewed and is accurate.       Tomasita CrumbleAdeleke Eugean Arnott, MD 01/21/17 2325

## 2017-01-22 NOTE — ED Notes (Signed)
Given a hot pack and cold pack for shoulder pain relief

## 2017-06-03 ENCOUNTER — Ambulatory Visit: Payer: Medicare HMO | Admitting: Nurse Practitioner

## 2017-06-04 ENCOUNTER — Encounter: Payer: Self-pay | Admitting: Nurse Practitioner

## 2017-10-13 ENCOUNTER — Emergency Department
Admission: EM | Admit: 2017-10-13 | Discharge: 2017-10-13 | Disposition: A | Discharge status: Home / Self Care | Payer: Medicare PPO | Attending: Emergency Medicine | Admitting: Emergency Medicine

## 2017-10-13 ENCOUNTER — Encounter: Payer: Self-pay | Admitting: Emergency Medicine

## 2017-10-13 ENCOUNTER — Emergency Department: Payer: Medicare PPO

## 2017-10-13 DIAGNOSIS — J209 Acute bronchitis, unspecified: Secondary | ICD-10-CM | POA: Diagnosis not present

## 2017-10-13 DIAGNOSIS — I10 Essential (primary) hypertension: Secondary | ICD-10-CM | POA: Diagnosis not present

## 2017-10-13 DIAGNOSIS — Z79899 Other long term (current) drug therapy: Secondary | ICD-10-CM | POA: Insufficient documentation

## 2017-10-13 DIAGNOSIS — R05 Cough: Secondary | ICD-10-CM | POA: Diagnosis present

## 2017-10-13 MED ORDER — LEVOFLOXACIN 500 MG PO TABS: 500.0000 mg | ORAL_TABLET | Freq: Every day | ORAL | 0 refills | Status: DC

## 2017-10-13 MED ORDER — IPRATROPIUM-ALBUTEROL 0.5-2.5 (3) MG/3ML IN SOLN
3.0000 mL | Freq: Once | RESPIRATORY_TRACT | Status: AC
  Administered 2017-10-13: 3 mL via RESPIRATORY_TRACT
  Filled 2017-10-13: qty 3

## 2017-10-13 MED ORDER — METHYLPREDNISOLONE 4 MG PO TBPK: ORAL_TABLET | ORAL | 0 refills | Status: DC

## 2017-10-13 NOTE — Discharge Instructions (Signed)
Take medications as prescribed. Also use her inhaler every or 6 hours and especially before bed. By over-the-counter Delsym cough syrup. Return to the emergency room if you are worsening. Follow-up with your regular doctor if any other problems

## 2017-10-13 NOTE — ED Notes (Signed)
Pt reports that she has a productive cough (thick yellow phlegm) with chills - pt has been sick for 4 days with no relief from OTC

## 2017-10-13 NOTE — ED Triage Notes (Signed)
Cold symptoms x 5 days

## 2017-10-13 NOTE — ED Provider Notes (Signed)
[facility] Emergency Department Provider Note  ____________________________________________   First MD Initiated Contact with Patient 10/13/17 1139     (approximate)  I have reviewed the triage vital signs and the nursing notes.   HISTORY  Chief Complaint Cough    HPI Tracie Barnes is a 66 y.o. female Complains of a cough for 4 days.States she feels like she is wheezing. Denies fever or chills. States her mucous is yellow to green. Denies chest pain or shortness of breath. Patient does have a history of lupus. Takes 10 mg of prednisone per day. Did take multiple over-the-counter medicines without any relief.   Past Medical History:  Diagnosis Date  . Allergy   . Fluid retention   . Hyperlipidemia   . Hypertension   . Systemic lupus erythematosus (HCC)   . Unspecified vitamin D deficiency     Patient Active Problem List   Diagnosis Date Noted  . Swallowing difficulty 05/30/2016  . Choking episode occurring both during daytime and at night 05/30/2016  . Muscle spasm 02/10/2014  . Fibromyalgia 02/10/2014    History reviewed. No pertinent surgical history.  Prior to Admission medications   Medication Sig Start Date End Date Taking? Authorizing Provider  ACIDOPHILUS LACTOBACILLUS PO Take 2 tablets by mouth daily.    [provider]  aspirin 81 MG tablet Take 81 mg by mouth daily.    [provider]  Biotin 78295 MCG TABS Take 2,000 mcg by mouth daily.    [provider]  Cholecalciferol 10000 units TABS Take 1,000 Units by mouth daily.    [provider]  dicyclomine (BENTYL) 10 MG capsule Take 10 mg by mouth 4 (four) times daily -  before meals and at bedtime.    [provider]  docusate sodium (COLACE) 100 MG capsule Take 100 mg by mouth 2 (two) times daily.    [provider]  folic acid (FOLVITE) 1 MG tablet Take 1 mg by mouth daily.    [provider]  guaifenesin (HUMIBID  E) 400 MG TABS tablet Take 400 mg by mouth 2 (two) times daily.    [provider]  hydroxychloroquine (PLAQUENIL) 200 MG tablet Take by mouth 2 (two) times daily.    [provider]  levofloxacin (LEVAQUIN) 500 MG tablet Take 1 tablet (500 mg total) by mouth daily. 10/13/17   Huan Pollok, Roselyn Bering, PA-C  loratadine (ALLERGY) 10 MG tablet Take 10 mg by mouth daily.      [provider]  methotrexate 2.5 MG tablet Take 12.5 mg by mouth once a week. Caution: Chemotherapy. Protect from light.     [provider]  methylPREDNISolone (MEDROL DOSEPAK) 4 MG TBPK tablet Take 6 pills on day one then decrease by 1 pill each day 10/13/17   Faythe Ghee, PA-C  metoprolol (LOPRESSOR) 50 MG tablet Take 12.5 mg by mouth.     [provider]  montelukast (SINGULAIR) 10 MG tablet Take 10 mg by mouth at bedtime.    [provider]  NORTRIPTYLINE HCL PO Take 25 mg by mouth 2 (two) times daily in the am and at bedtime.. 1 tab in am, 3 tabs at [facility]    [provider]  Omega-3 Fatty Acids (FISH OIL) 1000 MG CAPS Take 1 capsule by mouth daily.      [provider]  omeprazole (PRILOSEC) 20 MG capsule Take 20 mg by mouth 2 (two) times daily.    [provider]  ranitidine (ZANTAC) 300 MG capsule Take 300 mg by mouth every evening.    [provider]  triamterene-hydrochlorothiazide (MAXZIDE-25) 37.5-25 MG per tablet Take 0.5 tablets by mouth daily.    [provider]  vitamin E 400 UNIT capsule Take 800 Units by mouth daily.     [provider]    Allergies Ampicillin; Prednisone; and Sulfa antibiotics  History reviewed. No pertinent family history.  Social History Social History  Substance Use Topics  . Smoking status: Never Smoker  . Smokeless tobacco: Never Used  . Alcohol use No    Review of Systems  Constitutional: No fever/chills Eyes: No visual changes. ENT: No sore throat. Respiratory: positive  cough Genitourinary: Negative for dysuria. Musculoskeletal: Negative for back pain. Skin: Negative for rash.    ____________________________________________   PHYSICAL EXAM:  VITAL SIGNS: ED Triage Vitals  Enc Vitals Group     BP 10/13/17 1104 114/73     Pulse Rate 10/13/17 1104 (!) 109     Resp 10/13/17 1104 16     Temp 10/13/17 1104 99.8 F (37.7 C)     Temp Source 10/13/17 1104 Oral     SpO2 10/13/17 1104 99 %     Weight 10/13/17 1106 155 lb (70.3 kg)     Height 10/13/17 1106  (1.676 m)     Head Circumference --      Peak Flow --      Pain Score 10/13/17 1105 3     Pain Loc --      Pain Edu? --      Excl. in GC? --     Constitutional: Alert and oriented. Well appearing and in no acute distress. Eyes: Conjunctivae are normal.  Head: Atraumatic. Nose: No congestion/rhinnorhea. Mouth/Throat: Mucous membranes are moist.   Cardiovascular: Normal rate, regular rhythm. Respiratory: Normal respiratory effort.  No retractions, lungs with diminished breath sounds bilaterally. Breath sounds improved after DuoNeb. GU: deferred Musculoskeletal: FROM all extremities, warm and well perfused Neurologic:  Normal speech and language.  Skin:  Skin is warm, dry and intact. No rash noted. Psychiatric: Mood and affect are normal. Speech and behavior are normal.  ____________________________________________   LABS (all labs ordered are listed, but only abnormal results are displayed)  Labs Reviewed - No data to display ____________________________________________   ____________________________________________  RADIOLOGY  Chest x-ray was normal  ____________________________________________   PROCEDURES  Procedure(s) performed: No      ____________________________________________   INITIAL IMPRESSION / ASSESSMENT AND PLAN / ED COURSE  Pertinent labs & imaging results that were available during my care of the patient were reviewed by me and considered in my  medical decision making (see chart for details).  Patient appears well. Diagnosed with bronchitis. Patient improved after DuoNeb. Chest x-ray was normal. Due to patient's depressed immune system will prescribe Levaquin and Medrol Dosepak for the infection. Patient has an inhaler that she can use. Will use over-the-counter cough medicine.      ____________________________________________   FINAL CLINICAL IMPRESSION(S) / ED DIAGNOSES  Final diagnoses:  Acute bronchitis, unspecified organism      NEW MEDICATIONS STARTED DURING THIS VISIT:  Discharge Medication List as of 10/13/2017 12:42 PM    START taking these medications   Details  levofloxacin (LEVAQUIN) 500 MG tablet Take 1 tablet (500 mg total) by mouth daily., Starting Sun 10/13/2017, Print    methylPREDNISolone (MEDROL DOSEPAK) 4 MG TBPK tablet Take 6 pills on day one then decrease by 1 pill each day,  Print         Note:  This document was prepared using Dragon voice recognition software and may include unintentional dictation errors.    Faythe Ghee, PA-C 10/13/17 1349    Jene Every, MD 10/13/17 [phone]

## 2017-10-13 NOTE — ED Triage Notes (Signed)
FIRST NURSE NOTE-c/o fever with lupus/bronchitis hx.  Ambulatory. No distress at this time.

## 2018-06-26 ENCOUNTER — Encounter: Payer: Self-pay | Admitting: Emergency Medicine

## 2018-06-26 ENCOUNTER — Emergency Department
Admission: EM | Admit: 2018-06-26 | Discharge: 2018-06-26 | Disposition: A | Discharge status: Home / Self Care | Payer: Medicare PPO | Attending: Emergency Medicine | Admitting: Emergency Medicine

## 2018-06-26 DIAGNOSIS — M25461 Effusion, right knee: Secondary | ICD-10-CM | POA: Diagnosis not present

## 2018-06-26 DIAGNOSIS — Z79899 Other long term (current) drug therapy: Secondary | ICD-10-CM | POA: Insufficient documentation

## 2018-06-26 DIAGNOSIS — Z7982 Long term (current) use of aspirin: Secondary | ICD-10-CM | POA: Insufficient documentation

## 2018-06-26 DIAGNOSIS — I10 Essential (primary) hypertension: Secondary | ICD-10-CM | POA: Diagnosis not present

## 2018-06-26 DIAGNOSIS — M25561 Pain in right knee: Secondary | ICD-10-CM

## 2018-06-26 MED ORDER — DICLOFENAC SODIUM 25 MG PO TBEC
50.0000 mg | DELAYED_RELEASE_TABLET | Freq: Once | ORAL | Status: AC
  Administered 2018-06-26: 50 mg via ORAL
  Filled 2018-06-26: qty 2

## 2018-06-26 MED ORDER — DICLOFENAC SODIUM 50 MG PO TBEC: 50.0000 mg | DELAYED_RELEASE_TABLET | Freq: Two times a day (BID) | ORAL | 0 refills | Status: AC

## 2018-06-26 NOTE — ED Notes (Signed)
See triage note  Presents with right knee pain  States pain started about 1 week ago. Denies any injury  No deformity noted  Skin w/d

## 2018-06-26 NOTE — Discharge Instructions (Signed)
Your exam is consistent with a knee effusion. [facility]. Rest with the leg elevated and apply ice to reduce swelling. Take the prescription meds as directed.

## 2018-06-26 NOTE — ED Provider Notes (Signed)
[facility] Emergency Department Provider Note ____________________________________________  Time seen: 1110  I have reviewed the triage vital signs and the nursing notes.  HISTORY  Chief Complaint  Knee Pain  HPI [name] is a 67 y.o. female presents herself to the ED for evaluation of right knee pain and swelling.  Patient gives a history of lupus and related arthritis.  She has had intermittent pain to the knees in the past but is never experienced swelling to this point.  She describes tightness with range in the knee but denies any preceding injury by slipping, tripping, falling.  She is over-the-counter ibuprofen and aspirin for intermittent pain relief denies any significant benefit.  She was seen by her rheumatologist last week but did not have any swelling to the knee at that time.  He denies any distal paresthesias, leg pain, shortness of breath, or chest pain.  Past Medical History:  Diagnosis Date  . Allergy   . Fluid retention   . Hyperlipidemia   . Hypertension   . Systemic lupus erythematosus (HCC)   . Unspecified vitamin D deficiency     Patient Active Problem List   Diagnosis Date Noted  . Swallowing difficulty 05/30/2016  . Choking episode occurring both during daytime and at night 05/30/2016  . Muscle spasm 02/10/2014  . Fibromyalgia 02/10/2014    History reviewed. No pertinent surgical history.  Prior to Admission medications   Medication Sig Start Date End Date Taking? Authorizing Provider  ACIDOPHILUS LACTOBACILLUS PO Take 2 tablets by mouth daily.    [provider]  aspirin 81 MG tablet Take 81 mg by mouth daily.    [provider]  Biotin 1610910000 MCG TABS Take 2,000 mcg by mouth daily.    [provider]  Cholecalciferol 10000 units TABS Take 1,000 Units by mouth daily.    [provider]  diclofenac (VOLTAREN) 50 MG EC tablet Take 1 tablet (50 mg total) by mouth 2 (two) times daily for  15 days. 06/26/18 07/11/18  Deyanira Fesler, Charlesetta IvoryJenise V Bacon, PA-C  dicyclomine (BENTYL) 10 MG capsule Take 10 mg by mouth 4 (four) times daily -  before meals and at bedtime.    [provider]  docusate sodium (COLACE) 100 MG capsule Take 100 mg by mouth 2 (two) times daily.    [provider]  folic acid (FOLVITE) 1 MG tablet Take 1 mg by mouth daily.    [provider]  guaifenesin (HUMIBID E) 400 MG TABS tablet Take 400 mg by mouth 2 (two) times daily.    [provider]  hydroxychloroquine (PLAQUENIL) 200 MG tablet Take by mouth 2 (two) times daily.    [provider]  levofloxacin (LEVAQUIN) 500 MG tablet Take 1 tablet (500 mg total) by mouth daily. 10/13/17   Fisher, Roselyn BeringSusan W, PA-C  loratadine (ALLERGY) 10 MG tablet Take 10 mg by mouth daily.      [provider]  methotrexate 2.5 MG tablet Take 12.5 mg by mouth once a week. Caution: Chemotherapy. Protect from light.     [provider]  methylPREDNISolone (MEDROL DOSEPAK) 4 MG TBPK tablet Take 6 pills on day one then decrease by 1 pill each day 10/13/17   Faythe GheeFisher, Susan W, PA-C  metoprolol (LOPRESSOR) 50 MG tablet Take 12.5 mg by mouth.     [provider]  montelukast (SINGULAIR) 10 MG tablet Take 10 mg by mouth at bedtime.    [provider]  NORTRIPTYLINE HCL PO  Take 25 mg by mouth 2 (two) times daily in the am and at bedtime.. 1 tab in am, 3 tabs at [facility]    [provider]  Omega-3 Fatty Acids (FISH OIL) 1000 MG CAPS Take 1 capsule by mouth daily.      [provider]  omeprazole (PRILOSEC) 20 MG capsule Take 20 mg by mouth 2 (two) times daily.    [provider]  ranitidine (ZANTAC) 300 MG capsule Take 300 mg by mouth every evening.    [provider]  triamterene-hydrochlorothiazide (MAXZIDE-25) 37.5-25 MG per tablet Take 0.5 tablets by mouth daily.    [provider]  vitamin E 400 UNIT capsule Take 800 Units by mouth daily.      [provider]    Allergies Ampicillin; Prednisone; and Sulfa antibiotics  No family history on file.  Social History Social History   Tobacco Use  . Smoking status: Never Smoker  . Smokeless tobacco: Never Used  Substance Use Topics  . Alcohol use: No  . Drug use: No    Review of Systems  Constitutional: Negative for fever. Cardiovascular: Negative for chest pain. Respiratory: Negative for shortness of breath. Musculoskeletal: Negative for back pain.  Right knee pain as above. Skin: Negative for rash. Neurological: Negative for headaches, focal weakness or numbness. ____________________________________________  PHYSICAL EXAM:  VITAL SIGNS: ED Triage Vitals  Enc Vitals Group     BP 06/26/18 1024 121/81     Pulse Rate 06/26/18 1024 (!) 111     Resp --      Temp 06/26/18 1024 98.5 F (36.9 C)     Temp Source 06/26/18 1024 Oral     SpO2 06/26/18 1024 100 %     Weight 06/26/18 1024 155 lb (70.3 kg)     Height 06/26/18 1024 5\' 6"  (1.676 m)     Head Circumference --      Peak Flow --      Pain Score 06/26/18 1027 6     Pain Loc --      Pain Edu? --      Excl. in GC? --     Constitutional: Alert and oriented. Well appearing and in no distress. Head: Normocephalic and atraumatic. Cardiovascular: Normal rate, regular rhythm. Normal distal pulses. Respiratory: Normal respiratory effort.  Musculoskeletal: Right knee with a small joint effusion noted.  No obvious deformity, dislocation, or erythema noted.  Normal flexion extension range noted to the right knee.  Mild tender to palpation to the medial tibial plateau.  No significant valgus or varus joint stress.  No calf or Achilles tenderness is noted.  Nontender with normal range of motion in all extremities.  Neurologic:  Normal gait without ataxia. Normal speech and language. No gross focal neurologic deficits are appreciated. Skin:  Skin is warm, dry and intact. No rash  noted. ____________________________________________  PROCEDURES  Procedures Diclofenac 50 mg PO Ace bandage ____________________________________________  INITIAL IMPRESSION / ASSESSMENT AND PLAN / ED COURSE  Patient with ED evaluation of right knee pain and swelling.  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  She notes increased swelling over the last week.  Her exam is overall benign without any acute internal derangement suspected.  No injury by trauma or fall is reported.  Patient is deferred x-rays at this time.  She will be discharged with a prescription for diclofenac to dose as directed.  She will see her provider at the [facility] next week as scheduled.  ____________________________________________  FINAL CLINICAL IMPRESSION(S) / ED DIAGNOSES  Final diagnoses:  Acute pain of right knee  Effusion of right knee      Lissa Hoard, PA-C 06/26/18 1136    Dionne Bucy, MD 06/26/18 1556

## 2018-06-26 NOTE — ED Triage Notes (Signed)
Pt reports swelling and pain to her right knee for the past week. Pt reports a hx of lupus and arthritis so isn't sure if it is related. Denies obvious injuries.

## 2018-09-02 DIAGNOSIS — M25551 Pain in right hip: Secondary | ICD-10-CM | POA: Insufficient documentation

## 2018-10-15 ENCOUNTER — Ambulatory Visit: Payer: TRICARE For Life (TFL) | Admitting: Internal Medicine

## 2018-11-17 DIAGNOSIS — M8700 Idiopathic aseptic necrosis of unspecified bone: Secondary | ICD-10-CM | POA: Insufficient documentation

## 2018-11-18 ENCOUNTER — Ambulatory Visit: Payer: TRICARE For Life (TFL) | Admitting: Internal Medicine

## 2018-11-18 DIAGNOSIS — Z0289 Encounter for other administrative examinations: Secondary | ICD-10-CM

## 2018-12-03 ENCOUNTER — Telehealth: Payer: Self-pay | Admitting: Gastroenterology

## 2019-01-14 NOTE — Telephone Encounter (Signed)
Desiree will you please check and see if Dr.Beavers is still reviewing these records the [place] is wanting to follow up to see when patient can be scheduled.  Thank you, Erie Noe

## 2019-01-14 NOTE — Telephone Encounter (Signed)
Is there anyway they can refax records? Dr. Orvan Falconer said she does not have them anymore.

## 2019-01-16 ENCOUNTER — Encounter: Payer: Self-pay | Admitting: Gastroenterology

## 2019-01-16 NOTE — Telephone Encounter (Signed)
Records were in referral stack. Dr.Beavers reviewed them and advises on an ov first to discuss colon. Called patient and advised. Patient states she will cb to schedule ov. Referral in referral folder for now.  Tracie Barnes #[id number]  Valid dates: 10.28.19 - 4.27.2020

## 2019-02-06 ENCOUNTER — Ambulatory Visit: Payer: TRICARE For Life (TFL) | Admitting: Gastroenterology

## 2019-06-10 ENCOUNTER — Telehealth: Payer: Self-pay | Admitting: Gastroenterology

## 2019-06-10 ENCOUNTER — Other Ambulatory Visit: Payer: Self-pay

## 2019-06-10 ENCOUNTER — Telehealth: Payer: Self-pay

## 2019-06-10 DIAGNOSIS — Z86010 Personal history of colonic polyps: Secondary | ICD-10-CM

## 2019-06-10 MED ORDER — NA SULFATE-K SULFATE-MG SULF 17.5-3.13-1.6 GM/177ML PO SOLN: 1.0000 | Freq: Once | ORAL | 0 refills | Status: AC

## 2019-06-10 NOTE — Telephone Encounter (Signed)
Gastroenterology Pre-Procedure Review  Request Date: 06/30/19 Requesting Physician: Dr. Vicente Males  PATIENT REVIEW QUESTIONS: The patient responded to the following health history questions as indicated:    1. Are you having any GI issues? yes (constipation) 2. Do you have a personal history of Polyps? yes (year unknown) 3. Do you have a family history of Colon Cancer or Polyps? yes (sister and cousin colon polyps) 4. Diabetes Mellitus? no 5. Joint replacements in the past 12 months?no 6. Major health problems in the past 3 months?no 7. Any artificial heart valves, MVP, or defibrillator?no    MEDICATIONS & ALLERGIES:    Patient reports the following regarding taking any anticoagulation/antiplatelet therapy:   Plavix, Coumadin, Eliquis, Xarelto, Lovenox, Pradaxa, Brilinta, or Effient? no Aspirin? no  Patient confirms/reports the following medications:  Current Outpatient Medications  Medication Sig Dispense Refill  . ACIDOPHILUS LACTOBACILLUS PO Take 2 tablets by mouth daily.    Marland Kitchen aspirin 81 MG tablet Take 81 mg by mouth daily.    . Biotin 10000 MCG TABS Take 2,000 mcg by mouth daily.    . Cholecalciferol 10000 units TABS Take 1,000 Units by mouth daily.    Marland Kitchen dicyclomine (BENTYL) 10 MG capsule Take 10 mg by mouth 4 (four) times daily -  before meals and at bedtime.    . docusate sodium (COLACE) 100 MG capsule Take 100 mg by mouth 2 (two) times daily.    . folic acid (FOLVITE) 1 MG tablet Take 1 mg by mouth daily.    Marland Kitchen guaifenesin (HUMIBID E) 400 MG TABS tablet Take 400 mg by mouth 2 (two) times daily.    . hydroxychloroquine (PLAQUENIL) 200 MG tablet Take by mouth 2 (two) times daily.    Marland Kitchen levofloxacin (LEVAQUIN) 500 MG tablet Take 1 tablet (500 mg total) by mouth daily. 7 tablet 0  . loratadine (ALLERGY) 10 MG tablet Take 10 mg by mouth daily.      . methotrexate 2.5 MG tablet Take 12.5 mg by mouth once a week. Caution: Chemotherapy. Protect from light.     . methylPREDNISolone (MEDROL  DOSEPAK) 4 MG TBPK tablet Take 6 pills on day one then decrease by 1 pill each day 21 tablet 0  . metoprolol (LOPRESSOR) 50 MG tablet Take 12.5 mg by mouth.     . montelukast (SINGULAIR) 10 MG tablet Take 10 mg by mouth at bedtime.    . Na Sulfate-K Sulfate-Mg Sulf 17.5-3.13-1.6 GM/177ML SOLN Take 1 kit by mouth once for 1 dose. 354 mL 0  . NORTRIPTYLINE HCL PO Take 25 mg by mouth 2 (two) times daily in the am and at bedtime.. 1 tab in am, 3 tabs at HS    . Omega-3 Fatty Acids (FISH OIL) 1000 MG CAPS Take 1 capsule by mouth daily.      Marland Kitchen omeprazole (PRILOSEC) 20 MG capsule Take 20 mg by mouth 2 (two) times daily.    . ranitidine (ZANTAC) 300 MG capsule Take 300 mg by mouth every evening.    . triamterene-hydrochlorothiazide (MAXZIDE-25) 37.5-25 MG per tablet Take 0.5 tablets by mouth daily.    . vitamin E 400 UNIT capsule Take 800 Units by mouth daily.      No current facility-administered medications for this visit.     Patient confirms/reports the following allergies:  Allergies  Allergen Reactions  . Ampicillin     Pt had blisters all over her body Has patient had a PCN reaction causing immediate rash, facial/tongue/throat swelling, SOB or lightheadedness with hypotension:  no Has patient had a PCN reaction causing severe rash involving mucus membranes or skin necrosis: yes Has patient had a PCN reaction that required hospitalization: no Has patient had a PCN reaction occurring within the last 10 years: no If all of the above answers are "NO", then may proceed with Cephalosporin use.  . Prednisone   . Sulfa Antibiotics     No orders of the defined types were placed in this encounter.   AUTHORIZATION INFORMATION Primary Insurance: 1D#: Group #:  Secondary Insurance: 1D#: Group #:  SCHEDULE INFORMATION: Date: 06/30/19 Time: Location:[facility]

## 2019-06-10 NOTE — Telephone Encounter (Signed)
Pt is calling to schedule a colonoscopy cb [phone]

## 2019-06-10 NOTE — Telephone Encounter (Signed)
LVM returning patients call to schedule her for a colonoscopy.  Referral in [place].  Scheduling Note: History of Colon Polyps  Thanks Sharyn Lull

## 2019-06-26 ENCOUNTER — Other Ambulatory Visit: Payer: Self-pay

## 2019-06-26 ENCOUNTER — Other Ambulatory Visit
Admission: RE | Admit: 2019-06-26 | Discharge: 2019-06-26 | Disposition: A | Discharge status: Home / Self Care | Payer: Medicare Other | Source: Ambulatory Visit | Attending: Gastroenterology | Admitting: Gastroenterology

## 2019-06-26 DIAGNOSIS — Z1159 Encounter for screening for other viral diseases: Secondary | ICD-10-CM | POA: Insufficient documentation

## 2019-06-27 LAB — NOVEL CORONAVIRUS, NAA (HOSP ORDER, SEND-OUT TO REF LAB; TAT 18-24 HRS): SARS-CoV-2, NAA: NOT DETECTED

## 2019-06-30 ENCOUNTER — Encounter
Admission: RE | Disposition: A | Discharge status: Home / Self Care | Payer: Self-pay | Source: Home / Self Care | Attending: Gastroenterology

## 2019-06-30 ENCOUNTER — Other Ambulatory Visit: Payer: Self-pay

## 2019-06-30 ENCOUNTER — Ambulatory Visit
Admission: RE | Admit: 2019-06-30 | Discharge: 2019-06-30 | Disposition: A | Discharge status: Home / Self Care | Payer: Medicare Other | Attending: Gastroenterology | Admitting: Gastroenterology

## 2019-06-30 ENCOUNTER — Ambulatory Visit: Payer: Medicare Other | Admitting: Anesthesiology

## 2019-06-30 DIAGNOSIS — Z880 Allergy status to penicillin: Secondary | ICD-10-CM | POA: Diagnosis not present

## 2019-06-30 DIAGNOSIS — Z860100 Personal history of colon polyps, unspecified: Secondary | ICD-10-CM

## 2019-06-30 DIAGNOSIS — Z792 Long term (current) use of antibiotics: Secondary | ICD-10-CM | POA: Diagnosis not present

## 2019-06-30 DIAGNOSIS — I10 Essential (primary) hypertension: Secondary | ICD-10-CM | POA: Diagnosis not present

## 2019-06-30 DIAGNOSIS — Z79899 Other long term (current) drug therapy: Secondary | ICD-10-CM | POA: Diagnosis not present

## 2019-06-30 DIAGNOSIS — E559 Vitamin D deficiency, unspecified: Secondary | ICD-10-CM | POA: Insufficient documentation

## 2019-06-30 DIAGNOSIS — Z1211 Encounter for screening for malignant neoplasm of colon: Secondary | ICD-10-CM | POA: Diagnosis not present

## 2019-06-30 DIAGNOSIS — Z888 Allergy status to other drugs, medicaments and biological substances status: Secondary | ICD-10-CM | POA: Diagnosis not present

## 2019-06-30 DIAGNOSIS — Z882 Allergy status to sulfonamides status: Secondary | ICD-10-CM | POA: Diagnosis not present

## 2019-06-30 DIAGNOSIS — M329 Systemic lupus erythematosus, unspecified: Secondary | ICD-10-CM | POA: Insufficient documentation

## 2019-06-30 HISTORY — PX: COLONOSCOPY WITH PROPOFOL: SHX5780

## 2019-06-30 SURGERY — COLONOSCOPY WITH PROPOFOL
Anesthesia: General

## 2019-06-30 MED ORDER — LIDOCAINE HCL (PF) 2 % IJ SOLN
INTRAMUSCULAR | Status: AC
  Filled 2019-06-30: qty 10

## 2019-06-30 MED ORDER — SODIUM CHLORIDE 0.9 % IV SOLN
INTRAVENOUS | Status: DC
  Administered 2019-06-30: 09:00:00 via INTRAVENOUS

## 2019-06-30 MED ORDER — PROPOFOL 500 MG/50ML IV EMUL
INTRAVENOUS | Status: DC | PRN
  Administered 2019-06-30: 160 ug/kg/min via INTRAVENOUS

## 2019-06-30 MED ORDER — PROPOFOL 10 MG/ML IV BOLUS
INTRAVENOUS | Status: AC
  Filled 2019-06-30: qty 40

## 2019-06-30 MED ORDER — PROPOFOL 10 MG/ML IV BOLUS
INTRAVENOUS | Status: DC | PRN
  Administered 2019-06-30: 60 mg via INTRAVENOUS

## 2019-06-30 MED ORDER — PHENYLEPHRINE HCL (PRESSORS) 10 MG/ML IV SOLN
INTRAVENOUS | Status: DC | PRN
  Administered 2019-06-30: 50 ug via INTRAVENOUS

## 2019-06-30 NOTE — Op Note (Signed)
[facility] Gastroenterology Patient Name: Tracie CelesteSue Satterfield Procedure Date: 06/30/2019 8:41 AM MRN: [id number] Account #: [id number] Date of Birth: 1951/07/17 Admit Type: Outpatient Age: 68 Room[facility] END[facility] ROOM 1 Gender: Female Note Status: Finalized Procedure:            Colonoscopy Indications:          High risk colon cancer surveillance: Personal history                        of colonic polyps Providers:            Wyline MoodKiran Tamar Miano MD, MD Referring MD:         No Local Md, MD (Referring MD) Medicines:            Monitored Anesthesia Care Complications:        No immediate complications. Procedure:            Pre-Anesthesia Assessment:                       - Prior to the procedure, a History and Physical was                        performed, and patient medications, allergies and                        sensitivities were reviewed. The patient's tolerance of                        previous anesthesia was reviewed.                       - The risks and benefits of the procedure and the                        sedation options and risks were discussed with the                        patient. All questions were answered and informed                        consent was obtained.                       - ASA Grade Assessment: II - A patient with mild                        systemic disease.                       After obtaining informed consent, the colonoscope was                        passed under direct vision. Throughout the procedure,                        the patient's blood pressure, pulse, and oxygen                        saturations were monitored continuously. The  Colonoscope was introduced through the anus and                        advanced to the the cecum, identified by the                        appendiceal orifice, IC valve and transillumination.                        The colonoscopy was performed without difficulty. The            patient tolerated the procedure well. The quality of                        the bowel preparation was excellent. Findings:      The perianal and digital rectal examinations were normal.      The entire examined colon appeared normal on direct and retroflexion       views. Impression:           - The entire examined colon is normal on direct and                        retroflexion views.                       - No specimens collected. Recommendation:       - Discharge patient to home (with escort).                       - Resume previous diet.                       - Continue present medications.                       - Repeat colonoscopy in 5 years for surveillance. Procedure Code(s):    --- Professional ---                       [phone], Colonoscopy, flexible; diagnostic, including                        collection of specimen(s) by brushing or washing, when                        performed (separate procedure) Diagnosis Code(s):    --- Professional ---                       Z86.010, Personal history of colonic polyps CPT copyright 2019 [employer]. All rights reserved. The codes documented in this report are preliminary and upon coder review may  be revised to meet current compliance requirements. Jonathon Bellows, MD Jonathon Bellows MD, MD 06/30/2019 9:00:23 AM This report has been signed electronically. Number of Addenda: 0 Note Initiated On: 06/30/2019 8:41 AM Scope Withdrawal Time: 0 hours 8 minutes 27 seconds  Total Procedure Duration: 0 hours 11 minutes 39 seconds  Estimated Blood Loss: Estimated blood loss: none.      [facility]

## 2019-06-30 NOTE — H&P (Signed)
Tracie MoodKiran Diesel Lina, MD [address], [address], [place], [place], [id number] [address], [address], [place], [place], [address] Phone: [phone]  Fax: [phone]  Primary Care Physician:  System, Pcp Not In   Pre-Procedure History & Physical: HPI:  Tracie RoupSue Ashe Barnes is a 68 y.o. female is here for an colonoscopy.   Past Medical History:  Diagnosis Date   Allergy    Fluid retention    Hyperlipidemia    Hypertension    Systemic lupus erythematosus (HCC)    Unspecified vitamin D deficiency     Past Surgical History:  Procedure Laterality Date   COLONOSCOPY     ESOPHAGOGASTRODUODENOSCOPY      Prior to Admission medications   Medication Sig Start Date End Date Taking? Authorizing Provider  ACIDOPHILUS LACTOBACILLUS PO Take 2 tablets by mouth daily.   Yes [provider]  folic acid (FOLVITE) 1 MG tablet Take 1 mg by mouth daily.   Yes [provider]  guaifenesin (HUMIBID E) 400 MG TABS tablet Take 400 mg by mouth 2 (two) times daily.   Yes [provider]  hydroxychloroquine (PLAQUENIL) 200 MG tablet Take by mouth 2 (two) times daily.   Yes [provider]  levofloxacin (LEVAQUIN) 500 MG tablet Take 1 tablet (500 mg total) by mouth daily. 10/13/17  Yes Fisher, Roselyn BeringSusan W, PA-C  loratadine (ALLERGY) 10 MG tablet Take 10 mg by mouth daily.     Yes [provider]  methotrexate 2.5 MG tablet Take 12.5 mg by mouth once a week. Caution: Chemotherapy. Protect from light.    Yes [provider]  metoprolol (LOPRESSOR) 50 MG tablet Take 12.5 mg by mouth.    Yes [provider]  montelukast (SINGULAIR) 10 MG tablet Take 10 mg by mouth at bedtime.   Yes [provider]  NORTRIPTYLINE HCL PO Take 25 mg by mouth 2 (two) times daily in the am and at bedtime.. 1 tab in am, 3 tabs at HS   Yes [provider]  omeprazole (PRILOSEC) 20 MG capsule Take 20 mg by mouth 2 (two) times daily.   Yes [provider]  triamterene-hydrochlorothiazide (MAXZIDE-25) 37.5-25 MG per tablet Take 0.5 tablets by mouth daily.   Yes [provider]  aspirin 81 MG tablet Take 81 mg by mouth daily.    [provider]  Biotin 5284110000 MCG TABS Take 2,000 mcg by mouth daily.    [provider]  Cholecalciferol 10000 units TABS Take 1,000 Units by mouth daily.    [provider]  dicyclomine (BENTYL) 10 MG capsule Take 10 mg by mouth 4 (four) times daily -  before meals and at bedtime.    [provider]  docusate sodium (COLACE) 100 MG capsule Take 100 mg by mouth 2 (two) times daily.    [provider]  methylPREDNISolone (MEDROL DOSEPAK) 4 MG TBPK tablet Take 6 pills on day one then decrease by 1 pill each day Patient not taking: Reported on 06/30/2019 10/13/17   Faythe GheeFisher, Susan W, PA-C  Omega-3 Fatty Acids (FISH OIL) 1000 MG CAPS Take 1 capsule by mouth daily.      [provider]  ranitidine (ZANTAC) 300 MG capsule Take 300 mg by mouth every evening.    [provider]  vitamin E 400 UNIT capsule Take 800 Units by mouth daily.     [provider]    Allergies as of 06/10/2019 - Review Complete 06/26/2018  Allergen Reaction Noted  Ampicillin  04/13/2011   Prednisone  04/13/2011   Sulfa antibiotics  04/13/2011    History reviewed. No pertinent family history.  Social History   Socioeconomic History   Marital status: Married    Spouse name: Not on file   Number of children: Not on file   Years of education: Not on file   Highest education level: Not on file  Occupational History   Not on file  Social Needs   Financial resource strain: Not on file   Food insecurity    Worry: Not on file    Inability: Not on file   Transportation needs    Medical: Not on file    Non-medical: Not on file  Tobacco Use   Smoking status: Never Smoker   Smokeless tobacco: Never Used  Substance and Sexual Activity   Alcohol use: No    Drug use: No   Sexual activity: Not on file  Lifestyle   Physical activity    Days per week: Not on file    Minutes per session: Not on file   Stress: Not on file  Relationships   Social connections    Talks on phone: Not on file    Gets together: Not on file    Attends religious service: Not on file    Active member of club or organization: Not on file    Attends meetings of clubs or organizations: Not on file    Relationship status: Not on file   Intimate partner violence    Fear of current or ex partner: Not on file    Emotionally abused: Not on file    Physically abused: Not on file    Forced sexual activity: Not on file  Other Topics Concern   Not on file  Social History Narrative   Patient is single, has 3 children   Patient is right handed   Education level is Master's degree   Caffeine consumption is 0    Review of Systems: See HPI, otherwise negative ROS  Physical Exam: BP 136/87    Pulse (!) 109    Temp (!) 97.5 F (36.4 C)    Resp 18    Ht 5' 5.5" (1.664 m)    Wt 68 kg    SpO2 99%    BMI 24.58 kg/m  General:   Alert,  pleasant and cooperative in NAD Head:  Normocephalic and atraumatic. Neck:  Supple; no masses or thyromegaly. Lungs:  Clear throughout to auscultation, normal respiratory effort.    Heart:  +S1, +S2, Regular rate and rhythm, No edema. Abdomen:  Soft, nontender and nondistended. Normal bowel sounds, without guarding, and without rebound.   Neurologic:  Alert and  oriented x4;  grossly normal neurologically.  Impression/Plan: Tracie Barnes is here for an colonoscopy to be performed for surveillance due to prior history of colon polyps   Risks, benefits, limitations, and alternatives regarding  colonoscopy have been reviewed with the patient.  Questions have been answered.  All parties agreeable.   Jonathon Bellows, MD  06/30/2019, 8:43 AM

## 2019-06-30 NOTE — Anesthesia Post-op Follow-up Note (Signed)
Anesthesia QCDR form completed.        

## 2019-06-30 NOTE — Anesthesia Preprocedure Evaluation (Signed)
Anesthesia Evaluation  Patient identified by MRN, date of birth, ID band Patient awake    Reviewed: Allergy & Precautions, H&P , NPO status , Patient's Chart, lab work & pertinent test results, reviewed documented beta blocker date and time   Airway Mallampati: II   Neck ROM: full    Dental  (+) Poor Dentition   Pulmonary neg pulmonary ROS,    Pulmonary exam normal        Cardiovascular Exercise Tolerance: Poor hypertension, On Medications negative cardio ROS Normal cardiovascular exam Rhythm:regular Rate:Normal     Neuro/Psych  Neuromuscular disease negative psych ROS   GI/Hepatic negative GI ROS, Neg liver ROS,   Endo/Other  negative endocrine ROS  Renal/GU negative Renal ROS  negative genitourinary   Musculoskeletal   Abdominal   Peds  Hematology negative hematology ROS (+)   Anesthesia Other Findings Past Medical History: No date: Allergy No date: Fluid retention No date: Hyperlipidemia No date: Hypertension No date: Systemic lupus erythematosus ([place]) No date: Unspecified vitamin D deficiency Past Surgical History: No date: COLONOSCOPY No date: ESOPHAGOGASTRODUODENOSCOPY   Reproductive/Obstetrics negative OB ROS                             Anesthesia Physical Anesthesia Plan  ASA: II  Anesthesia Plan: General   Post-op Pain Management:    Induction:   PONV Risk Score and Plan:   Airway Management Planned:   Additional Equipment:   Intra-op Plan:   Post-operative Plan:   Informed Consent: I have reviewed the patients History and Physical, chart, labs and discussed the procedure including the risks, benefits and alternatives for the proposed anesthesia with the patient or authorized representative who has indicated his/her understanding and acceptance.     Dental Advisory Given  Plan Discussed with: CRNA  Anesthesia Plan Comments:         Anesthesia  Quick Evaluation

## 2019-06-30 NOTE — Transfer of Care (Signed)
Immediate Anesthesia Transfer of Care Note  Patient: Tracie Barnes  Procedure(s) Performed: COLONOSCOPY WITH PROPOFOL (N/A )  Patient Location: PACU  Anesthesia Type:General  Level of Consciousness: drowsy  Airway & Oxygen Therapy: Patient Spontanous Breathing and Patient connected to nasal cannula oxygen  Post-op Assessment: Report given to RN and Post -op Vital signs reviewed and stable  Post vital signs: Reviewed and stable  Last Vitals:  Vitals Value Taken Time  BP    Temp    Pulse    Resp    SpO2      Last Pain: There were no vitals filed for this visit.       Complications: No apparent anesthesia complications

## 2019-07-01 NOTE — Anesthesia Postprocedure Evaluation (Signed)
Anesthesia Post Note  Patient: Tracie Barnes  Procedure(s) Performed: COLONOSCOPY WITH PROPOFOL (N/A )  Patient location during evaluation: PACU Anesthesia Type: General Level of consciousness: awake and alert Pain management: pain level controlled Vital Signs Assessment: post-procedure vital signs reviewed and stable Respiratory status: spontaneous breathing, nonlabored ventilation, respiratory function stable and patient connected to nasal cannula oxygen Cardiovascular status: blood pressure returned to baseline and stable Postop Assessment: no apparent nausea or vomiting Anesthetic complications: no     Last Vitals:  Vitals:   06/30/19 0912 06/30/19 0922  BP: 109/70 123/78  Pulse: 88 90  Resp: 13 12  Temp:    SpO2: 100% 100%    Last Pain:  Vitals:   07/01/19 0747  TempSrc:   PainSc: 0-No pain                 Molli Barrows

## 2019-07-24 LAB — COMPREHENSIVE METABOLIC PANEL
Albumin: 3.4 — AB (ref 3.5–5.0)
Calcium: 8.3 — AB (ref 8.7–10.7)

## 2019-07-24 LAB — LIPID PANEL
Cholesterol: 210 — AB (ref 0–200)
HDL: 56 (ref 35–70)
LDL Cholesterol: 108
Triglycerides: 231 — AB (ref 40–160)

## 2019-07-24 LAB — HEPATIC FUNCTION PANEL
ALT: 34 (ref 7–35)
AST: 23 (ref 13–35)
Bilirubin, Total: 0.3

## 2019-07-24 LAB — HEMOGLOBIN A1C: Hemoglobin A1C: 6.3

## 2019-07-24 LAB — BASIC METABOLIC PANEL
BUN: 10 (ref 4–21)
CO2: 29 — AB (ref 13–22)
Chloride: 105 (ref 99–108)
Creatinine: 1.6 — AB (ref 0.5–1.1)
Glucose: 141
Potassium: 3.5 (ref 3.4–5.3)
Sodium: 139 (ref 137–147)

## 2020-04-27 DIAGNOSIS — M328 Other forms of systemic lupus erythematosus: Secondary | ICD-10-CM | POA: Insufficient documentation

## 2020-06-01 ENCOUNTER — Ambulatory Visit (INDEPENDENT_AMBULATORY_CARE_PROVIDER_SITE_OTHER): Payer: Medicare Other | Admitting: Family Medicine

## 2020-06-01 ENCOUNTER — Other Ambulatory Visit: Payer: Self-pay

## 2020-06-01 VITALS — BP 132/72 | HR 113 | Temp 97.6°F | Ht 65.25 in | Wt 161.0 lb

## 2020-06-01 DIAGNOSIS — M797 Fibromyalgia: Secondary | ICD-10-CM | POA: Diagnosis not present

## 2020-06-01 DIAGNOSIS — L930 Discoid lupus erythematosus: Secondary | ICD-10-CM | POA: Diagnosis not present

## 2020-06-01 DIAGNOSIS — J4540 Moderate persistent asthma, uncomplicated: Secondary | ICD-10-CM | POA: Diagnosis not present

## 2020-06-01 DIAGNOSIS — Z7689 Persons encountering health services in other specified circumstances: Secondary | ICD-10-CM

## 2020-06-01 DIAGNOSIS — J309 Allergic rhinitis, unspecified: Secondary | ICD-10-CM

## 2020-06-01 MED ORDER — NORTRIPTYLINE HCL 25 MG PO CAPS: ORAL_CAPSULE | ORAL | 1 refills | Status: DC

## 2020-06-01 NOTE — Patient Instructions (Addendum)
Can try a different antihistamine like cetirizine (generic Zyrtec) instead of loratadine.   Please follow up in 6 months, sooner if needed  You will get a call regarding an appointment with pulmonary

## 2020-06-01 NOTE — Progress Notes (Signed)
Subjective:    Patient ID: Tracie Barnes, female    DOB: Dec 21, 1951, 69 y.o.   MRN: [id number]  HPI Chief Complaint  Patient presents with  . New Patient (Initial Visit)    [facility]   This is a 69 yo female who presents today to establish care. Lives with her husband. 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 Enjoys learning, travel, gardening, decorating/ sewing.     Last CPE- ? 07/24/19's labs with her today from this date Mammo- 10/16/2019 Colonoscopy-06/30/2019 Tdap-unknown Flu- annual Eye- 04/2020 Dental- regular Exercise- regular  Lupus- sees dermatologist and rheumatologist through [place]. Would like to see local pulmonary. Was diagnosed in 2003.  Reports intermittent "night fevers," associated with her lupus.  She reports that she takes prednisone for several days approximately 3 times per month.  She is currently maintained for her lupus on Plaquenil, methotrexate.  Fibromyalgia-she takes nortriptyline 25 mg in the morning and 50 mg at bedtime and reports good sleep.  Has been seeing orthopedics for hip pain with good results.   Asthma and allergies- hoarse x 10 years. Taking loratadine, montelukast. Taking Symbicort daily and rarely uses albuterol. Has SOB, wheeze with activity, windy day, high humidity and heat. Ipratroprium nasal spray. Requests referral to local pulmonologist.  She reports a lot of mucus production and feels that this is managed with 4 times daily guaifenesin.  OSA- using CPAP with improvement.   GERD- pantoprazole 40 BID, famotadine 40 mg qd. Avoids triggers. Good control. Also lactose intolerant.   Review of Systems Denies chest pain, SOB, abdominal pain, diarrhea/ constipation, dysuria, urinary frequency, leg swelling. + fatigue, + muscle pain    Objective:   Physical Exam Vitals reviewed.  Constitutional:      General: She is not in acute distress.    Appearance: Normal appearance. She is normal weight. She is  not ill-appearing, toxic-appearing or diaphoretic.  HENT:     Head: Normocephalic and atraumatic.  Eyes:     Conjunctiva/sclera: Conjunctivae normal.  Cardiovascular:     Rate and Rhythm: Normal rate and regular rhythm.     Pulses: Normal pulses.     Heart sounds: Normal heart sounds.     Comments: HR on auscultation decreased to approximately 100.  Pulmonary:     Effort: Pulmonary effort is normal.     Breath sounds: Normal breath sounds.     Comments: Slightly harsh upper anterior breathing sounds noted.  Abdominal:     General: Abdomen is flat. Bowel sounds are normal.     Palpations: Abdomen is soft.  Musculoskeletal:     Right lower leg: No edema.     Left lower leg: No edema.  Skin:    General: Skin is warm and dry.  Neurological:     Mental Status: She is alert and oriented to person, place, and time.  Psychiatric:        Mood and Affect: Mood normal.        Behavior: Behavior normal.        Thought Content: Thought content normal.        Judgment: Judgment normal.       BP 132/72 (BP Location: Left Arm, Patient Position: Sitting, Cuff Size: Normal)   Pulse (!) 113   Temp 97.6 F (36.4 C) (Temporal)   Ht 5' 5.25" (1.657 m)   Wt 161 lb (73 kg)   SpO2 96%   BMI 26.59 kg/m  Assessment & Plan:  1. Encounter to establish care -Reviewed available records -Has additional records from [place]  2. Moderate persistent asthma without complication - Ambulatory referral to Pulmonology  3. Fibromyalgia - nortriptyline (PAMELOR) 25 MG capsule; 1 tab in am, 2 tabs at HS  Dispense: 270 capsule; Refill: 1  4. Discoid lupus -She plans to continue follow-up with dermatology and [facility]ology at the [facility]  5. Allergic rhinitis, unspecified seasonality, unspecified trigger -Discussed changing her long-acting antihistamine as she has been on the same 1 for many years -Also discussed thinning of secretions with guaifenesin and she may try to slowly reduce how much she is  taking  -Follow-up in 6 months This visit occurred during the SARS-CoV-2 public health emergency.  Safety protocols were in place, including screening questions prior to the visit, additional usage of staff PPE, and extensive cleaning of exam room while observing appropriate contact time as indicated for disinfecting solutions.      Clarene Reamer, FNP-BC  [facility]  06/02/2020 11:16 AM

## 2020-06-02 ENCOUNTER — Encounter: Payer: Self-pay | Admitting: Family Medicine

## 2020-06-14 ENCOUNTER — Ambulatory Visit (INDEPENDENT_AMBULATORY_CARE_PROVIDER_SITE_OTHER): Payer: Medicare Other | Admitting: Pulmonary Disease

## 2020-06-14 ENCOUNTER — Encounter: Payer: Self-pay | Admitting: Pulmonary Disease

## 2020-06-14 ENCOUNTER — Ambulatory Visit
Admission: RE | Admit: 2020-06-14 | Discharge: 2020-06-14 | Disposition: A | Discharge status: Home / Self Care | Payer: Medicare Other | Source: Ambulatory Visit | Attending: Pulmonary Disease | Admitting: Pulmonary Disease

## 2020-06-14 ENCOUNTER — Other Ambulatory Visit: Payer: Self-pay

## 2020-06-14 VITALS — BP 134/78 | HR 81 | Temp 97.5°F | Ht 65.0 in | Wt 161.6 lb

## 2020-06-14 DIAGNOSIS — R0602 Shortness of breath: Secondary | ICD-10-CM

## 2020-06-14 DIAGNOSIS — M329 Systemic lupus erythematosus, unspecified: Secondary | ICD-10-CM

## 2020-06-14 DIAGNOSIS — K219 Gastro-esophageal reflux disease without esophagitis: Secondary | ICD-10-CM | POA: Diagnosis not present

## 2020-06-14 DIAGNOSIS — J45909 Unspecified asthma, uncomplicated: Secondary | ICD-10-CM | POA: Diagnosis not present

## 2020-06-14 NOTE — Patient Instructions (Signed)
We are going to schedule a referral to gastroenterology (Dr. Tobi Bastos) for evaluation of your reflux issues.  We are going to obtain a chest x-ray.  Pending the results of the chest x-ray will determine if a chest CT is needed.  We are also scheduling you for an echocardiogram to evaluate the artery that goes from the heart to the lungs make sure that that is not under high pressure.  And you will be scheduled for breathing tests.  We will see you in follow-up in 4 to 6 weeks time call sooner should any new difficulties arise.

## 2020-06-14 NOTE — Progress Notes (Signed)
Subjective:    Patient ID: Tracie Barnes, female    DOB: February 21, 1951, 69 y.o.   MRN: [id number]  HPI The patient is a 69 year old lifelong never smoker who presents for evaluation of dyspnea present for years.  She became 1st aware of the symptom around 2001 when she was 1st diagnosed with systemic lupus erythematosus.  She has noted that she ge starts doing "heavy breathing "doing activities particularly when she is going up stairs or on inclines.  She states that resting makes her better.  She has previously been diagnosed with asthma and has been placed on Symbicort 160/4.5, 2 inhalations twice a day.  She notes that this also helps her some with her dyspnea but does not alleviate it completely.  She notes that previously she had been on "allergy shots" when she was stationed in [place] during her [employer] years.  She actually had to leave the [employer] due to her diagnosis of lupus.  She notes that her symptoms of dyspnea are exacerbated also by windy weather and when she notices fatigue.  She has severe issues with gastroesophageal reflux that are not controlled with her 20 mg of Prilosec daily.  She adheres to antireflux measures.  Does see Dr. Tobi Bastos for her GI issues.  She has no issues with chest pain.  She does note occasional orthopnea and occasional nocturnal awakenings with dyspnea.  She has not had any issues with lower extremity edema but does get some issues with joint tenderness and swelling.  No cough or sputum production.  No hemoptysis.  She is followed by rheumatology and immunology at the [place] in [place].   Review of Systems A 10 point review of systems was performed and it is as noted above otherwise negative.  Past Medical History:  Diagnosis Date  . Allergy   . Asthma   . Fluid retention   . Hyperlipidemia   . Hypertension   . Systemic lupus erythematosus (HCC)   . Unspecified vitamin D deficiency    Past Surgical History:  Procedure Laterality Date  .  COLONOSCOPY    . COLONOSCOPY WITH PROPOFOL N/A 06/30/2019   Procedure: COLONOSCOPY WITH PROPOFOL;  Surgeon: Wyline Mood, MD;  [facility] ENDOSCOPY;  Service: Gastroenterology;  Laterality: N/A;  . ESOPHAGOGASTRODUODENOSCOPY     Family History  Problem Relation Age of Onset  . Hypertension Father    Social History   Tobacco Use  . Smoking status: Never Smoker  . Smokeless tobacco: Never Used  Substance Use Topics  . Alcohol use: No   Served in the [employer] four years.  Served in [employer] in [place].  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  Retired from [employer] due to diagnosis of lupus.  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  Previously has resided in [place], [place] and [place].  Allergies  Allergen Reactions  . Ampicillin     Pt had blisters all over her body Has patient had a PCN reaction causing immediate rash, facial/tongue/throat swelling, SOB or lightheadedness with hypotension: no Has patient had a PCN reaction causing severe rash involving mucus membranes or skin necrosis: yes Has patient had a PCN reaction that required hospitalization: no Has patient had a PCN reaction occurring within the last 10 years: no If all of the above answers are "NO", then may proceed with Cephalosporin use.  . Prednisone   . Sulfa Antibiotics  Current Meds  Medication Sig  . ACIDOPHILUS LACTOBACILLUS PO Take 2 tablets by mouth daily.  . Albuterol Sulfate (PROAIR HFA IN) Inhale 2 puffs into the lungs every 6 (six) hours as needed.  Marland Kitchen aspirin 81 MG tablet Take 81 mg by mouth daily.  . budesonide-formoterol (SYMBICORT) 160-4.5 MCG/ACT inhaler Inhale 2 puffs into the lungs in the morning and at bedtime.  . Cholecalciferol 10000 units TABS Take 1,000 Units by mouth daily.  Marland Kitchen dicyclomine (BENTYL) 10 MG capsule Take 10 mg by mouth 4 (four) times daily -  before meals and at  bedtime.  . docusate sodium (COLACE) 100 MG capsule Take 100 mg by mouth 2 (two) times daily.  . folic acid (FOLVITE) 1 MG tablet Take 1 mg by mouth daily.  Marland Kitchen guaifenesin (HUMIBID E) 400 MG TABS tablet Take 400 mg by mouth 2 (two) times daily.  . hydroxychloroquine (PLAQUENIL) 200 MG tablet Take 1 tablet daily and 2 tablets every other day.  . loratadine (ALLERGY) 10 MG tablet Take 10 mg by mouth daily.    . methotrexate 2.5 MG tablet Take 12.5 mg by mouth once a week. Caution: Chemotherapy. Protect from light.   . methylPREDNISolone (MEDROL DOSEPAK) 4 MG TBPK tablet Take 6 pills on day one then decrease by 1 pill each day  . metoprolol (LOPRESSOR) 50 MG tablet Take 12.5 mg by mouth.   . montelukast (SINGULAIR) 10 MG tablet Take 10 mg by mouth at bedtime.  . nortriptyline (PAMELOR) 25 MG capsule 1 tab in am, 2 tabs at HS  . Omega-3 Fatty Acids (FISH OIL) 1000 MG CAPS Take 1 capsule by mouth daily.    Marland Kitchen omeprazole (PRILOSEC) 20 MG capsule Take 20 mg by mouth 2 (two) times daily.  . ranitidine (ZANTAC) 300 MG capsule Take 300 mg by mouth every evening.  . triamterene-hydrochlorothiazide (MAXZIDE-25) 37.5-25 MG per tablet Take 0.5 tablets by mouth daily.  . vitamin E 400 UNIT capsule Take 800 Units by mouth daily.    Immunization History  Administered Date(s) Administered  . Influenza,inj,quad, With Preservative 09/30/2017  . Influenza-Unspecified 10/05/2019  . Moderna SARS-COVID-2 Vaccination 02/08/2020, 03/14/2020  . Pneumococcal Polysaccharide-23 10/14/2018        Objective:   Physical Exam BP 134/78 (BP Location: Left Arm, Cuff Size: Normal)   Pulse 81   Temp (!) 97.5 F (36.4 C) (Temporal)   Ht 5\' 5"  (1.651 m)   Wt 161 lb 9.6 oz (73.3 kg)   SpO2 99%   BMI 26.89 kg/m   GENERAL: Well-developed, well-nourished woman, well-groomed, no acute distress.  Fully ambulatory.  No accessories. HEAD: Normocephalic, atraumatic.  EYES: Pupils equal, round, reactive to light.  No scleral  icterus.  MOUTH: Nose/mouth/throat not examined due to masking requirements for COVID 19. NECK: Supple. No thyromegaly. Trachea midline. No JVD.  No adenopathy. PULMONARY: Lungs clear to auscultation bilaterally. CARDIOVASCULAR: S1 and S2. Regular rate and rhythm.  No rubs, murmurs or gallops noted. GASTROINTESTINAL: No distention. MUSCULOSKELETAL: No joint deformity, no clubbing, no edema.  NEUROLOGIC: Awake, alert, speech is fluent.  Fully ambulatory with no overt gait disturbance.  No focal deficits noted. SKIN: Intact,warm,dry. PSYCH: Mood and behavior normal.   Chest x-ray performed today shows no parenchymal abnormality query prominent pulmonary arteries.  Independently reviewed:       Assessment & Plan:     ICD-10-CM   1. Shortness of breath  R06.02 DG Chest 2 View    ECHOCARDIOGRAM COMPLETE    Pulmonary function test  Patient aware of this at first diagnosis of SLE Obtain pulmonary function testing Chest x-ray today, normal 2D echo to evaluate ?PHTN  2. Systemic lupus erythematosus, unspecified SLE type, unspecified organ involvement status ([place])  M32.9    On Plaquenil Need to evaluate for potential lung involvement Evaluate for potential pulmonary hypertension  3. Chronic GERD  K21.9 Ambulatory referral to Gastroenterology   Very symptomatic despite PPI Referral to GI for evaluation with EGD  4. Asthma, unspecified asthma severity, unspecified whether complicated, unspecified whether persistent  J45.909    Appears to be compensated on Symbicort PFTs will help specify severity   Discussion:  Patient has had dyspnea of longstanding it appears that her first symptoms were related to her diagnosis of SLE in 2001.  She also has asthma blood by her own assessment, she feels that this is controlled with Symbicort in the sense that she does not have bronchospasm.  We will obtain pulmonary function testing as this can give [place] assessment of lung function and diffusion  capacity.  In addition recommend obtaining a 2D echo as patients with SLE are prone to pulmonary hypertension.  Chest x-ray obtained today was normal so no further imaging necessary unless pulmonary function testing shows issues with diffusion capacity.  Recommend continuing Symbicort for now.  No changes on respiratory medications unless PFTs indicate need for change.  We will see the patient in follow-up in 4 to 6 weeks time she is to contact us prior to that time should any new difficulties arise.   Renold Don, MD [facility]   *This note was dictated using voice recognition software/Dragon.  Despite best efforts to proofread, errors can occur which can change the meaning.  Any change was purely unintentional.

## 2020-06-15 ENCOUNTER — Telehealth: Payer: Self-pay | Admitting: Pulmonary Disease

## 2020-06-15 ENCOUNTER — Encounter: Payer: Self-pay | Admitting: Pulmonary Disease

## 2020-06-15 DIAGNOSIS — R0602 Shortness of breath: Secondary | ICD-10-CM | POA: Insufficient documentation

## 2020-06-15 NOTE — Telephone Encounter (Signed)
Tracie Saner, MD  Lajoyce Lauber A, CMA Chest x-ray was normal   Pt is aware of results and voiced her understanding.  Nothing further is needed.

## 2020-06-27 ENCOUNTER — Other Ambulatory Visit: Payer: Self-pay | Admitting: *Deleted

## 2020-07-05 ENCOUNTER — Ambulatory Visit
Admission: RE | Admit: 2020-07-05 | Discharge: 2020-07-05 | Disposition: A | Discharge status: Home / Self Care | Payer: Medicare Other | Source: Ambulatory Visit | Attending: Pulmonary Disease | Admitting: Pulmonary Disease

## 2020-07-05 ENCOUNTER — Telehealth: Payer: Self-pay

## 2020-07-05 ENCOUNTER — Other Ambulatory Visit: Payer: Self-pay

## 2020-07-05 DIAGNOSIS — I10 Essential (primary) hypertension: Secondary | ICD-10-CM | POA: Insufficient documentation

## 2020-07-05 DIAGNOSIS — R0602 Shortness of breath: Secondary | ICD-10-CM | POA: Insufficient documentation

## 2020-07-05 DIAGNOSIS — E785 Hyperlipidemia, unspecified: Secondary | ICD-10-CM | POA: Insufficient documentation

## 2020-07-05 DIAGNOSIS — M329 Systemic lupus erythematosus, unspecified: Secondary | ICD-10-CM | POA: Insufficient documentation

## 2020-07-05 NOTE — Progress Notes (Signed)
*  PRELIMINARY RESULTS* Echocardiogram 2D Echocardiogram has been performed.  Cristela Blue 07/05/2020, 10:46 AM

## 2020-07-05 NOTE — Telephone Encounter (Signed)
Lm to relay date/time of covid test prior PFT.  07/08/2020 between  8-1

## 2020-07-05 NOTE — Progress Notes (Signed)
*  PRELIMINARY RESULTS* Echocardiogram 2D Echocardiogram has been performed.  Tracie Barnes 07/05/2020, 10:46 AM 

## 2020-07-06 NOTE — Telephone Encounter (Signed)
Pt is aware of date/time of covid test and voiced her understanding.  °Nothing further is needed at this time. ° °

## 2020-07-08 ENCOUNTER — Other Ambulatory Visit
Admission: RE | Admit: 2020-07-08 | Discharge: 2020-07-08 | Disposition: A | Discharge status: Home / Self Care | Payer: Medicare Other | Source: Ambulatory Visit | Attending: Pulmonary Disease | Admitting: Pulmonary Disease

## 2020-07-08 ENCOUNTER — Other Ambulatory Visit: Payer: Self-pay

## 2020-07-08 DIAGNOSIS — Z01812 Encounter for preprocedural laboratory examination: Secondary | ICD-10-CM | POA: Insufficient documentation

## 2020-07-08 DIAGNOSIS — Z20822 Contact with and (suspected) exposure to covid-19: Secondary | ICD-10-CM | POA: Insufficient documentation

## 2020-07-09 LAB — SARS CORONAVIRUS 2 (TAT 6-24 HRS): SARS Coronavirus 2: NEGATIVE

## 2020-07-11 ENCOUNTER — Other Ambulatory Visit: Payer: Self-pay

## 2020-07-11 ENCOUNTER — Ambulatory Visit (HOSPITAL_COMMUNITY)
Admission: RE | Admit: 2020-07-11 | Discharge: 2020-07-11 | Disposition: A | Discharge status: Home / Self Care | Payer: Medicare Other | Source: Ambulatory Visit | Attending: Pulmonary Disease | Admitting: Pulmonary Disease

## 2020-07-11 DIAGNOSIS — R0602 Shortness of breath: Secondary | ICD-10-CM | POA: Insufficient documentation

## 2020-07-11 LAB — PULMONARY FUNCTION TEST
DL/VA % pred: 94 %
DL/VA: 3.88 ml/min/mmHg/L
DLCO unc % pred: 73 %
DLCO unc: 15.03 ml/min/mmHg
FEF 25-75 Post: 1.42 L/s
FEF 25-75 Pre: 3.07 L/s
FEF2575-%Change-Post: -53 %
FEF2575-%Pred-Post: 79 %
FEF2575-%Pred-Pre: 170 %
FEV1-%Change-Post: -16 %
FEV1-%Pred-Post: 97 %
FEV1-%Pred-Pre: 117 %
FEV1-Post: 1.92 L
FEV1-Pre: 2.31 L
FEV1FVC-%Change-Post: -6 %
FEV1FVC-%Pred-Pre: 109 %
FEV6-%Change-Post: -13 %
FEV6-%Pred-Post: 96 %
FEV6-%Pred-Pre: 111 %
FEV6-Post: 2.37 L
FEV6-Pre: 2.73 L
FEV6FVC-%Pred-Post: 103 %
FEV6FVC-%Pred-Pre: 103 %
FVC-%Change-Post: -11 %
FVC-%Pred-Post: 95 %
FVC-%Pred-Pre: 107 %
FVC-Post: 2.42 L
FVC-Pre: 2.73 L
Post FEV1/FVC ratio: 79 %
Post FEV6/FVC ratio: 100 %
Pre FEV1/FVC ratio: 85 %
Pre FEV6/FVC Ratio: 100 %
RV % pred: 155 %
RV: 3.52 L
TLC % pred: 116 %
TLC: 6.16 L

## 2020-07-11 MED ORDER — ALBUTEROL SULFATE (2.5 MG/3ML) 0.083% IN NEBU
2.5000 mg | INHALATION_SOLUTION | Freq: Once | RESPIRATORY_TRACT | Status: AC
  Administered 2020-07-11: 2.5 mg via RESPIRATORY_TRACT

## 2020-07-25 ENCOUNTER — Encounter: Payer: Self-pay | Admitting: Pulmonary Disease

## 2020-07-25 ENCOUNTER — Other Ambulatory Visit: Payer: Self-pay

## 2020-07-25 ENCOUNTER — Ambulatory Visit: Payer: Medicare Other | Admitting: Pulmonary Disease

## 2020-07-25 VITALS — BP 130/78 | HR 99 | Temp 97.5°F | Ht 65.0 in | Wt 162.8 lb

## 2020-07-25 DIAGNOSIS — R0602 Shortness of breath: Secondary | ICD-10-CM

## 2020-07-25 DIAGNOSIS — J383 Other diseases of vocal cords: Secondary | ICD-10-CM

## 2020-07-25 DIAGNOSIS — M329 Systemic lupus erythematosus, unspecified: Secondary | ICD-10-CM

## 2020-07-25 DIAGNOSIS — K219 Gastro-esophageal reflux disease without esophagitis: Secondary | ICD-10-CM

## 2020-07-25 MED ORDER — BREZTRI AEROSPHERE 160-9-4.8 MCG/ACT IN AERO: 2.0000 | INHALATION_SPRAY | Freq: Two times a day (BID) | RESPIRATORY_TRACT | 0 refills | Status: DC

## 2020-07-25 NOTE — Progress Notes (Signed)
 Assessment & Plan:  1. Shortness of breath (Primary)  2. Systemic lupus erythematosus, unspecified SLE type, unspecified organ involvement status (HCC)  3. Chronic GERD  4. Vocal cord dysfunction   Patient Instructions  We are going to give you a trial of Breztri  2 inhalations twice a day.  Do not use Symbicort  while using the Breztri .  Let us  know how you do with the Breztri  and we can call it in for you.  Rinse your mouth well after using Breztri , you may put a little baking soda (sodium bicarbonate) in the water that you rinse with.  We will see you in follow-up in 4 to 6 weeks time.  Please make sure that you have an appointment with Dr. Therisa.  I believe a lot of your issues are related to reflux.  Please note: late entry documentation due to logistical difficulties during COVID-19 pandemic. This note is filed for information purposes only, and is not intended to be used for billing, nor does it represent the full scope/nature of the visit in question. Please see any associated scanned media linked to date of encounter for additional pertinent information.  Subjective:    HPI: Tracie Barnes is a 69 y.o. female presenting to the pulmonology clinic on 07/25/2020 with report of: Follow-up (review echo--sob with exertion and non prod cough. )     Outpatient Encounter Medications as of 07/25/2020  Medication Sig Note   ACIDOPHILUS LACTOBACILLUS PO Take 2 tablets by mouth daily.    Cholecalciferol 10000 units TABS Take 1,000 Units by mouth daily.    dicyclomine (BENTYL) 10 MG capsule Take 10 mg by mouth 4 (four) times daily -  before meals and at bedtime. (Patient not taking: Reported on 09/29/2024)    docusate sodium (COLACE) 100 MG capsule Take 100 mg by mouth 2 (two) times daily. (Patient not taking: Reported on 09/29/2024)    IPRATROPIUM BROMIDE NA Place 1 spray into the nose in the morning and at bedtime. (Patient not taking: Reported on 09/29/2024)    nortriptyline   (PAMELOR ) 25 MG capsule 1 tab in am, 2 tabs at HS (Patient not taking: Reported on 09/29/2024)    Omega-3 Fatty Acids (FISH OIL) 1000 MG CAPS Take 1 capsule by mouth daily. (Patient not taking: Reported on 09/29/2024)    pantoprazole (PROTONIX) 40 MG tablet Take 40 mg by mouth 2 (two) times daily. (Patient not taking: Reported on 09/29/2024)    vitamin E 400 UNIT capsule Take 800 Units by mouth daily.     [DISCONTINUED] Albuterol  Sulfate (PROAIR  HFA IN) Inhale 2 puffs into the lungs every 6 (six) hours as needed.    [DISCONTINUED] aspirin 81 MG tablet Take 81 mg by mouth daily.    [DISCONTINUED] budesonide -formoterol  (SYMBICORT ) 160-4.5 MCG/ACT inhaler Inhale 2 puffs into the lungs in the morning and at bedtime.    [DISCONTINUED] famotidine (PEPCID) 40 MG tablet Take 40 mg by mouth daily.    [DISCONTINUED] folic acid (FOLVITE) 1 MG tablet Take 1 mg by mouth daily.    [DISCONTINUED] guaifenesin  (HUMIBID E) 400 MG TABS tablet Take 400 mg by mouth 2 (two) times daily.    [DISCONTINUED] hydroxychloroquine (PLAQUENIL) 200 MG tablet Take 1 tablet daily and 2 tablets every other day.    [DISCONTINUED] loratadine (CLARITIN) 10 MG tablet Take 10 mg by mouth daily.    [DISCONTINUED] methotrexate 2.5 MG tablet Take 12.5 mg by mouth once a week. Caution: Chemotherapy. Protect from light. (Patient not taking: Reported on 05/09/2021)    [  DISCONTINUED] methylPREDNISolone  (MEDROL  DOSEPAK) 4 MG TBPK tablet Take 6 pills on day one then decrease by 1 pill each day 06/01/2020: For night fevers   [DISCONTINUED] metoprolol  (LOPRESSOR ) 50 MG tablet Take 12.5 mg by mouth.     [DISCONTINUED] montelukast  (SINGULAIR ) 10 MG tablet Take 10 mg by mouth at bedtime.    [DISCONTINUED] omeprazole (PRILOSEC) 20 MG capsule Take 20 mg by mouth 2 (two) times daily.    [DISCONTINUED] triamterene -hydrochlorothiazide (MAXZIDE-25) 37.5-25 MG per tablet Take 0.5 tablets by mouth daily. Pt takes 1/4 of a tablet dailey    [DISCONTINUED]  Budeson-Glycopyrrol-Formoterol  (BREZTRI  AEROSPHERE) 160-9-4.8 MCG/ACT AERO Inhale 2 puffs into the lungs 2 (two) times daily.    No facility-administered encounter medications on file as of 07/25/2020.      Objective:   Vitals:   07/25/20 0926  BP: (!) 130/78  Pulse: 99  Temp: (!) 97.5 F (36.4 C)  Height: 5' 5 (1.651 m)  Weight: 162 lb 12.8 oz (73.8 kg)  SpO2: 99%  TempSrc: Temporal  BMI (Calculated): 27.09     Physical exam documentation is limited by delayed entry of information.

## 2020-07-25 NOTE — Patient Instructions (Signed)
We are going to give you a trial of Breztri 2 inhalations twice a day.  Do not use Symbicort while using the Breztri.  Let us know how you do with the [facility] and we can call it in for you.  Rinse your mouth well after using Breztri, you may put a little baking soda (sodium bicarbonate) in the water that you rinse with.  We will see you in follow-up in 4 to 6 weeks time.  Please make sure that you have an appointment with Dr. Tobi Bastos.  I believe a lot of your issues are related to reflux.

## 2020-08-25 ENCOUNTER — Encounter: Payer: Self-pay | Admitting: Gastroenterology

## 2020-08-25 ENCOUNTER — Ambulatory Visit (INDEPENDENT_AMBULATORY_CARE_PROVIDER_SITE_OTHER): Payer: Medicare Other | Admitting: Gastroenterology

## 2020-08-25 VITALS — BP 97/64 | HR 90 | Temp 98.7°F | Ht 65.0 in | Wt 161.0 lb

## 2020-08-25 DIAGNOSIS — K219 Gastro-esophageal reflux disease without esophagitis: Secondary | ICD-10-CM

## 2020-08-25 NOTE — Progress Notes (Signed)
Wyline Mood MD, MRCP(U.K) [address]  [address]  [place], [place] [address]  Main: [phone]  Fax: [phone]   Gastroenterology Consultation  Referring Provider:     Salena Saner, MD Primary Care Physician:  Emi Belfast, FNP Primary Gastroenterologist:  Dr. Wyline Mood  Reason for Consultation:    GERD        HPI:   Tracie Barnes is a 69 y.o. y/o female referred for acid reflux by Dr. Jayme Cloud.  I performed a colonoscopy in June 2020: Personal history of colon polyps colonoscopy was normal.  She follows with Dr. Jayme Cloud for dyspnea.  History of lupus.  Referred for acid reflux.  She has had acid reflux for over 15 years. On famotidine 40 mg that she takes twice a day. This does not work well enough. Symptoms are heartburn and regurgitation. Worse when she lays down flat. No recent weight gain or weight loss. Denies any dysphagia. She says she had an EGD 2 years back at the [place] and showed no issues with esophagus. Has a dinner at 8:30 PM and goes to bed at 10 PM. Uses a pillow at night but does not use a wedge pillow.  Past Medical History:  Diagnosis Date  . Allergy   . Asthma   . Fluid retention   . Hyperlipidemia   . Hypertension   . Systemic lupus erythematosus (HCC)   . Unspecified vitamin D deficiency     Past Surgical History:  Procedure Laterality Date  . COLONOSCOPY    . COLONOSCOPY WITH PROPOFOL N/A 06/30/2019   Procedure: COLONOSCOPY WITH PROPOFOL;  Surgeon: Wyline Mood, MD;  Location: [facility] ENDOSCOPY;  Service: Gastroenterology;  Laterality: N/A;  . ESOPHAGOGASTRODUODENOSCOPY      Prior to Admission medications   Medication Sig Start Date End Date Taking? Authorizing Provider  ACIDOPHILUS LACTOBACILLUS PO Take 2 tablets by mouth daily.    [provider]  Albuterol Sulfate (PROAIR HFA IN) Inhale 2 puffs into the lungs every 6 (six) hours as needed.    [provider]  aspirin 81 MG tablet Take 81 mg by mouth daily.     [provider]  Budeson-Glycopyrrol-Formoterol (BREZTRI AEROSPHERE) 160-9-4.8 MCG/ACT AERO Inhale 2 puffs into the lungs 2 (two) times daily. 07/25/20   Salena Saner, MD  budesonide-formoterol [facility]) 160-4.5 MCG/ACT inhaler Inhale 2 puffs into the lungs in the morning and at bedtime.    [provider]  Cholecalciferol 10000 units TABS Take 1,000 Units by mouth daily.    [provider]  dicyclomine (BENTYL) 10 MG capsule Take 10 mg by mouth 4 (four) times daily -  before meals and at bedtime.    [provider]  docusate sodium (COLACE) 100 MG capsule Take 100 mg by mouth 2 (two) times daily.    [provider]  famotidine (PEPCID) 40 MG tablet Take 40 mg by mouth daily.    [provider]  folic acid (FOLVITE) 1 MG tablet Take 1 mg by mouth daily.    [provider]  guaifenesin (HUMIBID E) 400 MG TABS tablet Take 400 mg by mouth 2 (two) times daily.    [provider]  hydroxychloroquine (PLAQUENIL) 200 MG tablet Take 1 tablet daily and 2 tablets every other day.    [provider]  IPRATROPIUM BROMIDE NA Place 1 spray into the nose in the morning and at bedtime.    [provider]  loratadine (ALLERGY) 10 MG tablet Take  10 mg by mouth daily.      [provider]  methotrexate 2.5 MG tablet Take 12.5 mg by mouth once a week. Caution: Chemotherapy. Protect from light.     [provider]  metoprolol (LOPRESSOR) 50 MG tablet Take 12.5 mg by mouth.     [provider]  montelukast (SINGULAIR) 10 MG tablet Take 10 mg by mouth at bedtime.    [provider]  nortriptyline (PAMELOR) 25 MG capsule 1 tab in am, 2 tabs at [facility] 06/01/20   Emi Belfast, FNP  Omega-3 Fatty Acids (FISH OIL) 1000 MG CAPS Take 1 capsule by mouth daily.      [provider]  omeprazole (PRILOSEC) 20 MG capsule Take 20 mg by mouth 2 (two) times daily.    [provider]    pantoprazole (PROTONIX) 40 MG tablet Take 40 mg by mouth 2 (two) times daily.    [provider]  triamterene-hydrochlorothiazide (MAXZIDE-25) 37.5-25 MG per tablet Take 0.5 tablets by mouth daily.    [provider]  vitamin E 400 UNIT capsule Take 800 Units by mouth daily.     [provider]    Family History  Problem Relation Age of Onset  . Hypertension Father      Social History   Tobacco Use  . Smoking status: Never Smoker  . Smokeless tobacco: Never Used  Substance Use Topics  . Alcohol use: No  . Drug use: No    Allergies as of 08/25/2020 - Review Complete 08/25/2020  Allergen Reaction Noted  . Ampicillin  04/13/2011  . Prednisone  04/13/2011  . Sulfa antibiotics  04/13/2011    Review of Systems:    All systems reviewed and negative except where noted in HPI.   Physical Exam:  BP 97/64   Pulse 90   Temp 98.7 F (37.1 C)   Ht 5\' 5"  (1.651 m)   Wt 161 lb (73 kg)   BMI 26.79 kg/m  No LMP recorded. Patient is postmenopausal. Psych:  Alert and cooperative. Normal mood and affect. General:   Alert,  Well-developed, well-nourished, pleasant and cooperative in NAD Head:  Normocephalic and atraumatic. Eyes:  Sclera clear, no icterus.   Conjunctiva pink. Ears:  Normal auditory acuity. Neurologic:  Alert and oriented x3;  grossly normal neurologically. Psych:  Alert and cooperative. Normal mood and affect.  Imaging Studies: No results found.  Assessment and Plan:   Tracie Barnes is a 69 y.o. y/o female has been referred for acid reflux. Longstanding for over 15 years, etiology was normal. At responded to famotidine. She has been taking her medications with her meals which is probably contributing to the same.  Plan 1.  GERD: Patient information provided advised to eat 2 hours before bedtime, use a wedge pillow, a PPI first thing in the morning on an empty stomach and 30 minutes after. Commenced on Dexilant samples provided for 2  weeks. I will speak to her back in 6 weeks time if no better will consider endoscopic evaluation.    Follow up in 6 weeks telephone or in person visit  Dr 66 MD,MRCP(U.K)

## 2020-08-25 NOTE — Patient Instructions (Signed)

## 2020-09-06 ENCOUNTER — Telehealth: Payer: Self-pay | Admitting: Pulmonary Disease

## 2020-09-06 MED ORDER — BREZTRI AEROSPHERE 160-9-4.8 MCG/ACT IN AERO: 2.0000 | INHALATION_SPRAY | Freq: Two times a day (BID) | RESPIRATORY_TRACT | 6 refills | Status: DC

## 2020-09-06 NOTE — Telephone Encounter (Signed)
Spoke with pt. She feels like Tracie Barnes has really helped and would like rx sent to pharmacy. Breztri rx sent to pharmacy. Nothing further needed.

## 2020-09-07 ENCOUNTER — Other Ambulatory Visit: Payer: Self-pay

## 2020-09-07 MED ORDER — DEXILANT 60 MG PO CPDR: 60.0000 mg | DELAYED_RELEASE_CAPSULE | Freq: Every day | ORAL | 1 refills | Status: DC

## 2020-10-04 ENCOUNTER — Other Ambulatory Visit: Payer: Self-pay

## 2020-10-04 ENCOUNTER — Encounter: Payer: Self-pay | Admitting: Family Medicine

## 2020-10-04 ENCOUNTER — Ambulatory Visit (INDEPENDENT_AMBULATORY_CARE_PROVIDER_SITE_OTHER): Payer: Medicare Other | Admitting: Family Medicine

## 2020-10-04 VITALS — BP 118/74 | HR 85 | Temp 97.0°F | Ht 65.0 in | Wt 157.2 lb

## 2020-10-04 DIAGNOSIS — N3001 Acute cystitis with hematuria: Secondary | ICD-10-CM | POA: Diagnosis not present

## 2020-10-04 DIAGNOSIS — N3000 Acute cystitis without hematuria: Secondary | ICD-10-CM | POA: Insufficient documentation

## 2020-10-04 DIAGNOSIS — R350 Frequency of micturition: Secondary | ICD-10-CM

## 2020-10-04 LAB — POC URINALSYSI DIPSTICK (AUTOMATED)
Bilirubin, UA: NEGATIVE
Blood, UA: 50
Glucose, UA: NEGATIVE
Ketones, UA: NEGATIVE
Nitrite, UA: NEGATIVE
Protein, UA: POSITIVE — AB
Spec Grav, UA: 1.01 (ref 1.010–1.025)
Urobilinogen, UA: 0.2 E.U./dL
pH, UA: 7 (ref 5.0–8.0)

## 2020-10-04 MED ORDER — GUAIFENESIN 400 MG PO TABS
400.0000 mg | ORAL_TABLET | Freq: Two times a day (BID) | ORAL | 0 refills | Status: DC
Start: 2020-10-04 — End: 2021-07-18

## 2020-10-04 MED ORDER — CEPHALEXIN 500 MG PO CAPS: 500.0000 mg | ORAL_CAPSULE | Freq: Two times a day (BID) | ORAL | 0 refills | Status: DC

## 2020-10-04 NOTE — Assessment & Plan Note (Signed)
With frequency and urgency and positive UA Pt saw blood once and there is micro heme in sample tx with keflex 500 mg bid  inst to inc water intake  Give handout re: uti  Culture pending-will call and check in with result inst pt to call in meantime if symptoms suddenly worsen

## 2020-10-04 NOTE — Progress Notes (Signed)
Subjective:    Patient ID: Tracie Barnes, female    DOB: 05-21-1951, 69 y.o.   MRN: [id number]  This visit occurred during the SARS-CoV-2 public health emergency.  Safety protocols were in place, including screening questions prior to the visit, additional usage of staff PPE, and extensive cleaning of exam room while observing appropriate contact time as indicated for disinfecting solutions.    HPI 69 yo pt of NP Leone Payor presents with urinary symptoms   Wt Readings from Last 3 Encounters:  10/04/20 157 lb 3 oz (71.3 kg)  08/25/20 161 lb (73 kg)  07/25/20 162 lb 12.8 oz (73.8 kg)   26.16 kg/m  She occ gets utis  Tries to drink a lot of water   Urgency and frequency of urination -last week  Some incontinence  Some low back pain /no flank pain  No bladder pressure  Urine looks cloudy  Saw a little blood one time  No change in odor   Feels hot but does not have a fever   Results for orders placed or performed in visit on 10/04/20  POCT Urinalysis Dipstick (Automated)  Result Value Ref Range   Color, UA Yellow    Clarity, UA Cloudy    Glucose, UA Negative Negative   Bilirubin, UA Negative    Ketones, UA Negative    Spec Grav, UA 1.010 1.010 - 1.025   Blood, UA 50 Ery/uL    pH, UA 7.0 5.0 - 8.0   Protein, UA Positive (A) Negative   Urobilinogen, UA 0.2 0.2 or 1.0 E.U./dL   Nitrite, UA Negative    Leukocytes, UA Large (3+) (A) Negative    [facility] left and she also needs refill of guaifenesin until she can get an allergist    Patient Active Problem List   Diagnosis Date Noted  . Acute cystitis 10/04/2020  . Shortness of breath 06/15/2020  . Other forms of systemic lupus erythematosus (HCC) 04/27/2020  . History of colonic polyps   . Avascular necrosis of bone (HCC) 11/17/2018  . Pain in joint of right hip 09/02/2018  . 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 Swallowing difficulty 05/30/2016  . Choking episode occurring both during daytime and at night 05/30/2016  .  Muscle spasm 02/10/2014  . Fibromyalgia 02/10/2014   Past Medical History:  Diagnosis Date  . Allergy   . Asthma   . Fluid retention   . Hyperlipidemia   . Hypertension   . Systemic lupus erythematosus (HCC)   . Unspecified vitamin D deficiency    Past Surgical History:  Procedure Laterality Date  . COLONOSCOPY    . COLONOSCOPY WITH PROPOFOL N/A 06/30/2019   Procedure: COLONOSCOPY WITH PROPOFOL;  Surgeon: Wyline Mood, MD;  Location: [facility] ENDOSCOPY;  Service: Gastroenterology;  Laterality: N/A;  . ESOPHAGOGASTRODUODENOSCOPY     Social History   Tobacco Use  . Smoking status: Never Smoker  . Smokeless tobacco: Never Used  Substance Use Topics  . Alcohol use: No  . Drug use: No   Family History  Problem Relation Age of Onset  . Hypertension Father    Allergies  Allergen Reactions  . Prednisone   . Sulfa Antibiotics    Current Outpatient Medications on File Prior to Visit  Medication Sig Dispense Refill  . ACIDOPHILUS LACTOBACILLUS PO Take 2 tablets by mouth daily.    . Albuterol Sulfate (PROAIR HFA IN) Inhale 2 puffs into the lungs every 6 (six) hours as needed.    Marland Kitchen aspirin 81 MG  tablet Take 81 mg by mouth daily.    . Budeson-Glycopyrrol-Formoterol (BREZTRI AEROSPHERE) 160-9-4.8 MCG/ACT AERO Inhale 2 puffs into the lungs 2 (two) times daily. 10.7 g 6  . budesonide-formoterol (SYMBICORT) 160-4.5 MCG/ACT inhaler Inhale 2 puffs into the lungs in the morning and at bedtime.    . Cholecalciferol 10000 units TABS Take 1,000 Units by mouth daily.    Marland Kitchen dexlansoprazole (DEXILANT) 60 MG capsule Take 1 capsule (60 mg total) by mouth daily. 90 capsule 1  . dicyclomine (BENTYL) 10 MG capsule Take 10 mg by mouth 4 (four) times daily -  before meals and at bedtime.    . docusate sodium (COLACE) 100 MG capsule Take 100 mg by mouth 2 (two) times daily.    . famotidine (PEPCID) 40 MG tablet Take 40 mg by mouth daily.    . folic acid (FOLVITE) 1 MG tablet Take 1 mg by mouth daily.    .  hydroxychloroquine (PLAQUENIL) 200 MG tablet Take 1 tablet daily and 2 tablets every other day.    . IPRATROPIUM BROMIDE NA Place 1 spray into the nose in the morning and at bedtime.    Marland Kitchen loratadine (ALLERGY) 10 MG tablet Take 10 mg by mouth daily.      . methotrexate 2.5 MG tablet Take 12.5 mg by mouth once a week. Caution: Chemotherapy. Protect from light.     . metoprolol (LOPRESSOR) 50 MG tablet Take 12.5 mg by mouth.     . montelukast (SINGULAIR) 10 MG tablet Take 10 mg by mouth at bedtime.    . nortriptyline (PAMELOR) 25 MG capsule 1 tab in am, 2 tabs at HS 270 capsule 1  . Omega-3 Fatty Acids (FISH OIL) 1000 MG CAPS Take 1 capsule by mouth daily.      Marland Kitchen omeprazole (PRILOSEC) 20 MG capsule Take 20 mg by mouth 2 (two) times daily.    . pantoprazole (PROTONIX) 40 MG tablet Take 40 mg by mouth 2 (two) times daily.    Marland Kitchen triamterene-hydrochlorothiazide (MAXZIDE-25) 37.5-25 MG per tablet Take 0.5 tablets by mouth daily.    . vitamin E 400 UNIT capsule Take 800 Units by mouth daily.      No current facility-administered medications on file prior to visit.     Review of Systems  Constitutional: Negative for activity change, appetite change, fatigue, fever and unexpected weight change.  HENT: Negative for congestion, ear pain, rhinorrhea, sinus pressure and sore throat.   Eyes: Negative for pain, redness and visual disturbance.  Respiratory: Negative for cough, shortness of breath and wheezing.   Cardiovascular: Negative for chest pain and palpitations.  Gastrointestinal: Negative for abdominal pain, blood in stool, constipation and diarrhea.  Endocrine: Negative for polydipsia and polyuria.  Genitourinary: Positive for frequency, hematuria and urgency. Negative for dysuria and flank pain.  Musculoskeletal: Negative for arthralgias, back pain and myalgias.  Skin: Negative for pallor and rash.  Allergic/Immunologic: Negative for environmental allergies.  Neurological: Negative for dizziness,  syncope and headaches.  Hematological: Negative for adenopathy. Does not bruise/bleed easily.  Psychiatric/Behavioral: Negative for decreased concentration and dysphoric mood. The patient is not nervous/anxious.        Objective:   Physical Exam Constitutional:      General: She is not in acute distress.    Appearance: Normal appearance. She is well-developed and normal weight. She is not ill-appearing or diaphoretic.  HENT:     Head: Normocephalic and atraumatic.  Eyes:     General: No scleral icterus.  Conjunctiva/sclera: Conjunctivae normal.     Pupils: Pupils are equal, round, and reactive to light.  Cardiovascular:     Rate and Rhythm: Normal rate and regular rhythm.     Heart sounds: Normal heart sounds.  Pulmonary:     Effort: Pulmonary effort is normal. No respiratory distress.     Breath sounds: Normal breath sounds.  Abdominal:     General: Bowel sounds are normal. There is no distension.     Palpations: Abdomen is soft.     Tenderness: There is no abdominal tenderness. There is right CVA tenderness. There is no rebound.     Comments: Very mild R CVA tenderness   no suprapubic tenderness or fullness  Musculoskeletal:     Cervical back: Normal range of motion and neck supple.  Lymphadenopathy:     Cervical: No cervical adenopathy.  Skin:    General: Skin is warm and dry.  Neurological:     Mental Status: She is alert.  Psychiatric:        Mood and Affect: Mood normal.           Assessment & Plan:   Problem List Items Addressed This Visit      Genitourinary   Acute cystitis - Primary    With frequency and urgency and positive UA Pt saw blood once and there is micro heme in sample tx with keflex 500 mg bid  inst to inc water intake  Give handout re: uti  Culture pending-will call and check in with result inst pt to call in meantime if symptoms suddenly worsen       Relevant Orders   Urine Culture    Other Visit Diagnoses    Urinary  frequency       Relevant Orders   POCT Urinalysis Dipstick (Automated) (Completed)   Urine Culture

## 2020-10-04 NOTE — Patient Instructions (Addendum)
Drink water  Take the generic keflex as directed  We will call when culture returns   If symptoms suddenly worsen-please alert [place]    Urinary Tract Infection, Adult  A urinary tract infection (UTI) is an infection of any part of the urinary tract. The urinary tract includes the kidneys, ureters, bladder, and urethra. These organs make, store, and get rid of urine in the body. Your health care provider may use other names to describe the infection. An upper UTI affects the ureters and kidneys (pyelonephritis). A lower UTI affects the bladder (cystitis) and urethra (urethritis). What are the causes? Most urinary tract infections are caused by bacteria in your genital area, around the entrance to your urinary tract (urethra). These bacteria grow and cause inflammation of your urinary tract. What increases the risk? You are more likely to develop this condition if:  You have a urinary catheter that stays in place (indwelling).  You are not able to control when you urinate or have a bowel movement (you have incontinence).  You are female and you: ? Use a spermicide or diaphragm for birth control. ? Have low estrogen levels. ? Are pregnant.  You have certain genes that increase your risk (genetics).  You are sexually active.  You take antibiotic medicines.  You have a condition that causes your flow of urine to slow down, such as: ? An enlarged prostate, if you are female. ? Blockage in your urethra (stricture). ? A kidney stone. ? A nerve condition that affects your bladder control (neurogenic bladder). ? Not getting enough to drink, or not urinating often.  You have certain medical conditions, such as: ? Diabetes. ? A weak disease-fighting system (immunesystem). ? Sickle cell disease. ? Gout. ? Spinal cord injury. What are the signs or symptoms? Symptoms of this condition include:  Needing to urinate right away (urgently).  Frequent urination or passing small amounts of  urine frequently.  Pain or burning with urination.  Blood in the urine.  Urine that smells bad or unusual.  Trouble urinating.  Cloudy urine.  Vaginal discharge, if you are female.  Pain in the abdomen or the lower back. You may also have:  Vomiting or a decreased appetite.  Confusion.  Irritability or tiredness.  A fever.  Diarrhea. The first symptom in older adults may be confusion. In some cases, they may not have any symptoms until the infection has worsened. How is this diagnosed? This condition is diagnosed based on your medical history and a physical exam. You may also have other tests, including:  Urine tests.  Blood tests.  Tests for sexually transmitted infections (STIs). If you have had more than one UTI, a cystoscopy or imaging studies may be done to determine the cause of the infections. How is this treated? Treatment for this condition includes:  Antibiotic medicine.  Over-the-counter medicines to treat discomfort.  Drinking enough water to stay hydrated. If you have frequent infections or have other conditions such as a kidney stone, you may need to see a health care provider who specializes in the urinary tract (urologist). In rare cases, urinary tract infections can cause sepsis. Sepsis is a life-threatening condition that occurs when the body responds to an infection. Sepsis is treated in the hospital with IV antibiotics, fluids, and other medicines. Follow these instructions at home:  Medicines  Take over-the-counter and prescription medicines only as told by your health care provider.  If you were prescribed an antibiotic medicine, take it as told by your health  care provider. Do not stop using the antibiotic even if you start to feel better. General instructions  Make sure you: ? Empty your bladder often and completely. Do not hold urine for long periods of time. ? Empty your bladder after sex. ? Wipe from front to back after a bowel  movement if you are female. Use each tissue one time when you wipe.  Drink enough fluid to keep your urine pale yellow.  Keep all follow-up visits as told by your health care provider. This is important. Contact a health care provider if:  Your symptoms do not get better after 1-2 days.  Your symptoms go away and then return. Get help right away if you have:  Severe pain in your back or your lower abdomen.  A fever.  Nausea or vomiting. Summary  A urinary tract infection (UTI) is an infection of any part of the urinary tract, which includes the kidneys, ureters, bladder, and urethra.  Most urinary tract infections are caused by bacteria in your genital area, around the entrance to your urinary tract (urethra).  Treatment for this condition often includes antibiotic medicines.  If you were prescribed an antibiotic medicine, take it as told by your health care provider. Do not stop using the antibiotic even if you start to feel better.  Keep all follow-up visits as told by your health care provider. This is important. This information is not intended to replace advice given to you by your health care provider. Make sure you discuss any questions you have with your health care provider. Document Revised: 12/04/2018 Document Reviewed: 06/26/2018 Elsevier Patient Education  2020 [employer].

## 2020-10-07 LAB — URINE CULTURE
MICRO NUMBER:: 11033278
SPECIMEN QUALITY:: ADEQUATE

## 2020-12-02 ENCOUNTER — Encounter: Payer: Self-pay | Admitting: Family Medicine

## 2020-12-02 ENCOUNTER — Other Ambulatory Visit: Payer: Self-pay

## 2020-12-02 ENCOUNTER — Ambulatory Visit (INDEPENDENT_AMBULATORY_CARE_PROVIDER_SITE_OTHER): Payer: Medicare Other | Admitting: Family Medicine

## 2020-12-02 VITALS — BP 122/70 | HR 89 | Temp 97.3°F | Ht 65.25 in | Wt 158.8 lb

## 2020-12-02 DIAGNOSIS — I10 Essential (primary) hypertension: Secondary | ICD-10-CM | POA: Diagnosis not present

## 2020-12-02 DIAGNOSIS — Z23 Encounter for immunization: Secondary | ICD-10-CM

## 2020-12-02 DIAGNOSIS — J988 Other specified respiratory disorders: Secondary | ICD-10-CM | POA: Diagnosis not present

## 2020-12-02 DIAGNOSIS — E785 Hyperlipidemia, unspecified: Secondary | ICD-10-CM | POA: Diagnosis not present

## 2020-12-02 DIAGNOSIS — R7303 Prediabetes: Secondary | ICD-10-CM | POA: Diagnosis not present

## 2020-12-02 LAB — COMPREHENSIVE METABOLIC PANEL
ALT: 18 U/L (ref 0–35)
AST: 19 U/L (ref 0–37)
Albumin: 4 g/dL (ref 3.5–5.2)
Alkaline Phosphatase: 62 U/L (ref 39–117)
BUN: 13 mg/dL (ref 6–23)
CO2: 35 mEq/L — ABNORMAL HIGH (ref 19–32)
Calcium: 9 mg/dL (ref 8.4–10.5)
Chloride: 99 mEq/L (ref 96–112)
Creatinine, Ser: 1.31 mg/dL — ABNORMAL HIGH (ref 0.40–1.20)
GFR: 41.25 mL/min — ABNORMAL LOW (ref >60.00)
Glucose, Bld: 85 mg/dL (ref 70–99)
Potassium: 3.7 mEq/L (ref 3.5–5.1)
Sodium: 140 mEq/L (ref 135–145)
Total Bilirubin: 0.4 mg/dL (ref 0.2–1.2)
Total Protein: 6.7 g/dL (ref 6.0–8.3)

## 2020-12-02 LAB — CBC WITH DIFFERENTIAL/PLATELET
Basophils Absolute: 0 10*3/uL (ref 0.0–0.1)
Basophils Relative: 0.7 % (ref 0.0–3.0)
Eosinophils Absolute: 0 10*3/uL (ref 0.0–0.7)
Eosinophils Relative: 0.5 % (ref 0.0–5.0)
HCT: 37 % (ref 36.0–46.0)
Hemoglobin: 12.6 g/dL (ref 12.0–15.0)
Lymphocytes Relative: 22.1 % (ref 12.0–46.0)
Lymphs Abs: 1.3 10*3/uL (ref 0.7–4.0)
MCHC: 33.9 g/dL (ref 30.0–36.0)
MCV: 94.6 fl (ref 78.0–100.0)
Monocytes Absolute: 0.4 10*3/uL (ref 0.1–1.0)
Monocytes Relative: 7.2 % (ref 3.0–12.0)
Neutro Abs: 4 10*3/uL (ref 1.4–7.7)
Neutrophils Relative %: 69.5 % (ref 43.0–77.0)
Platelets: 286 10*3/uL (ref 150.0–400.0)
RBC: 3.91 Mil/uL (ref 3.87–5.11)
RDW: 13.7 % (ref 11.5–15.5)
WBC: 5.7 10*3/uL (ref 4.0–10.5)

## 2020-12-02 LAB — LIPID PANEL
Cholesterol: 209 mg/dL — ABNORMAL HIGH (ref 0–200)
HDL: 69.6 mg/dL (ref >39.00)
LDL Cholesterol: 110 mg/dL — ABNORMAL HIGH (ref 0–99)
NonHDL: 139.37
Total CHOL/HDL Ratio: 3
Triglycerides: 148 mg/dL (ref 0.0–149.0)
VLDL: 29.6 mg/dL (ref 0.0–40.0)

## 2020-12-02 LAB — HEMOGLOBIN A1C: Hgb A1c MFr Bld: 6.1 % (ref 4.6–6.5)

## 2020-12-02 MED ORDER — METOPROLOL TARTRATE 25 MG PO TABS: 12.5000 mg | ORAL_TABLET | Freq: Every day | ORAL | 1 refills | Status: AC

## 2020-12-02 MED ORDER — TRIAMTERENE-HCTZ 37.5-25 MG PO TABS: 0.5000 | ORAL_TABLET | Freq: Every day | ORAL | 1 refills | Status: AC

## 2020-12-02 NOTE — Progress Notes (Signed)
   Subjective:    Patient ID: Tracie Barnes, female    DOB: 05/13/1951, 68 y.o.   MRN: [id number]  HPI Chief Complaint  Patient presents with  . Follow-up    6 month    This is a 69 year old female who presents today for follow-up of chronic medical conditions. She  has a past medical history of Allergy, Asthma, Fluid retention, Hyperlipidemia, Hypertension, Systemic lupus erythematosus (HCC), and Unspecified vitamin D deficiency.  SOB- has been seen by pulmonary, has had some change in her inhalers. Breathing is "not good." She is requesting to see ENT. Previously followed by allergy at [facility]. Hears "rattle," feels SOB at rest. Hears herself breathing, "like a cow." Feels like there is a "clog of mucus," that doesn't go away. Does not cough. Normal PFTs,   GERD- has seen Dr. Tobi Bastos, currently on pantoprazole 40 mg twice a day and prn pepcid with good results.   Review of Systems Per HPI    Objective:   Physical Exam Physical Exam  Constitutional: Oriented to person, place, and time. Appears well-developed and well-nourished.  HENT:  Head: Normocephalic and atraumatic.  Eyes: Conjunctivae are normal.  Neck: Normal range of motion. Neck supple.  Cardiovascular: Normal rate, regular rhythm and normal heart sounds.   Pulmonary/Chest: Effort normal and breath sounds normal.  Musculoskeletal: No lower extremity edema.   Neurological: Alert and oriented to person, place, and time.  Skin: Skin is warm and dry.  Psychiatric: Normal mood and affect. Behavior is normal. Judgment and thought content normal.  Vitals reviewed.     BP 122/70   Pulse 89   Temp (!) 97.3 F (36.3 C) (Temporal)   Ht 5' 5.25" (1.657 m)   Wt 158 lb 12 oz (72 kg)   SpO2 99%   BMI 26.22 kg/m  Wt Readings from Last 3 Encounters:  12/02/20 158 lb 12 oz (72 kg)  10/04/20 157 lb 3 oz (71.3 kg)  08/25/20 161 lb (73 kg)       Assessment & Plan:  1. Essential hypertension - well controlled, continue current  medications - Hemoglobin A1c - CBC with Differential - Comprehensive metabolic panel  2. Need for influenza vaccination - Flu Vaccine QUAD High Dose(Fluad)  3. Prediabetes - Hemoglobin A1c - CBC with Differential - Comprehensive metabolic panel - Lipid Panel  4. Congestion of upper airway - Ambulatory referral to ENT  5. Elevated lipids - Lipid Panel  - follow up in 6 months for CPE  This visit occurred during the SARS-CoV-2 public health emergency.  Safety protocols were in place, including screening questions prior to the visit, additional usage of staff PPE, and extensive cleaning of exam room while observing appropriate contact time as indicated for disinfecting solutions.    Olean Ree, FNP-BC  [facility], [facility]  12/02/2020 6:04 PM

## 2020-12-05 ENCOUNTER — Encounter: Payer: Self-pay | Admitting: Family Medicine

## 2020-12-06 ENCOUNTER — Ambulatory Visit: Payer: Medicare Other | Attending: Internal Medicine

## 2020-12-06 DIAGNOSIS — Z23 Encounter for immunization: Secondary | ICD-10-CM

## 2020-12-06 NOTE — Progress Notes (Signed)
   Covid-19 Vaccination Clinic  Name:  Tracie Barnes    MRN: [id number] DOB: 01/14/1951  12/06/2020  [name] was observed post Covid-19 immunization for 15 minutes without incident. She was provided with Vaccine Information Sheet and instruction to access the V-Safe system.   [name] was instructed to call 911 with any severe reactions post vaccine: Marland Kitchen Difficulty breathing  . Swelling of face and throat  . A fast heartbeat  . A bad rash all over body  . Dizziness and weakness   Immunizations Administered    Name Date Dose VIS Date Route   Moderna COVID-19 Vaccine 12/06/2020  2:33 PM 0.5 mL 10/19/2020 Intramuscular   Manufacturer: Moderna   Lot: [id number]   NDC: 27741-287-86

## 2021-03-17 ENCOUNTER — Ambulatory Visit
Admission: RE | Admit: 2021-03-17 | Discharge: 2021-03-17 | Disposition: A | Discharge status: Home / Self Care | Payer: Medicare Other | Source: Ambulatory Visit | Attending: Otolaryngology | Admitting: Otolaryngology

## 2021-03-17 ENCOUNTER — Ambulatory Visit
Admission: RE | Admit: 2021-03-17 | Discharge: 2021-03-17 | Disposition: A | Discharge status: Home / Self Care | Payer: Medicare Other | Attending: Otolaryngology | Admitting: Otolaryngology

## 2021-03-17 ENCOUNTER — Other Ambulatory Visit: Payer: Self-pay | Admitting: Otolaryngology

## 2021-03-17 ENCOUNTER — Other Ambulatory Visit: Payer: Self-pay

## 2021-03-17 DIAGNOSIS — R0602 Shortness of breath: Secondary | ICD-10-CM | POA: Diagnosis present

## 2021-05-09 ENCOUNTER — Ambulatory Visit: Payer: Self-pay

## 2021-05-09 ENCOUNTER — Other Ambulatory Visit: Payer: Self-pay

## 2021-05-09 ENCOUNTER — Encounter: Payer: Self-pay | Admitting: Allergy & Immunology

## 2021-05-09 ENCOUNTER — Ambulatory Visit (INDEPENDENT_AMBULATORY_CARE_PROVIDER_SITE_OTHER): Payer: No Typology Code available for payment source | Admitting: Allergy & Immunology

## 2021-05-09 VITALS — BP 130/78 | HR 89 | Temp 98.2°F | Resp 16 | Ht 65.5 in | Wt 154.6 lb

## 2021-05-09 DIAGNOSIS — J302 Other seasonal allergic rhinitis: Secondary | ICD-10-CM

## 2021-05-09 DIAGNOSIS — J4540 Moderate persistent asthma, uncomplicated: Secondary | ICD-10-CM | POA: Diagnosis not present

## 2021-05-09 DIAGNOSIS — J3089 Other allergic rhinitis: Secondary | ICD-10-CM

## 2021-05-09 MED ORDER — AZELASTINE HCL 0.1 % NA SOLN: 2.0000 | Freq: Two times a day (BID) | NASAL | 5 refills | Status: DC | PRN

## 2021-05-09 MED ORDER — BUDESONIDE-FORMOTEROL FUMARATE 160-4.5 MCG/ACT IN AERO: 2.0000 | INHALATION_SPRAY | Freq: Two times a day (BID) | RESPIRATORY_TRACT | 5 refills | Status: DC

## 2021-05-09 MED ORDER — FLUTICASONE PROPIONATE 50 MCG/ACT NA SUSP: 1.0000 | Freq: Every day | NASAL | 1 refills | Status: DC | PRN

## 2021-05-09 MED ORDER — CETIRIZINE HCL 10 MG PO TABS: 10.0000 mg | ORAL_TABLET | Freq: Every day | ORAL | 5 refills | Status: DC | PRN

## 2021-05-09 NOTE — Progress Notes (Signed)
NEW PATIENT  Date of Service/Encounter:  05/09/21  Consult requested by: Elby Beck, FNP (Inactive)   Assessment:   Moderate persistent asthma, uncomplicated  Seasonal and perennial allergic rhinitis (grasses, ragweed, weeds, trees and dust mites) - initiating allergen immunotherapy  Lupus  Plan/Recommendations:    1. Moderate persistent asthma, uncomplicated - Lung testing was in the 50% range, but it improved to 100% with the bronchodilator (albuterol) treatment. - This certainly points towards asthma. - I think you need to take a daily controller medication but the best management of your symptoms. - Spacer sample and demonstration provided. - Daily controller medication(s): Symbicort 160/4.49mg two puffs twice daily with spacer - Prior to physical activity: albuterol 2 puffs 10-15 minutes before physical activity. - Rescue medications: albuterol 4 puffs every 4-6 hours as needed - Asthma control goals:  * Full participation in all desired activities (may need albuterol before activity) * Albuterol use two time or less a week on average (not counting use with activity) * Cough interfering with sleep two time or less a month * Oral steroids no more than once a year * No hospitalizations  2. Seasonal and perennial allergic rhinitis - Testing today showed: grasses, ragweed, weeds, trees and dust mites - Copy of test results provided.  - Avoidance measures provided. - Continue with: Zyrtec (cetirizine) 137mtablet once daily and Flonase (fluticasone) one spray per nostril daily - Start taking: Astelin (azelastine) 2 sprays per nostril 1-2 times daily as needed - You can use an extra dose of the antihistamine, if needed, for breakthrough symptoms.  - Consider nasal saline rinses 1-2 times daily to remove allergens from the nasal cavities as well as help with mucous clearance (this is especially helpful to do before the nasal sprays are given) - Consider allergy  shots as a means of long-term control. - Allergy shots "re-train" and "reset" the immune system to ignore environmental allergens and decrease the resulting immune response to those allergens (sneezing, itchy watery eyes, runny nose, nasal congestion, etc).    - Allergy shots improve symptoms in 75-85% of patients.  - Let usKoreanow if you want to start allergy shots.   3. Return in about 2 months (around 07/09/2021).    This note in its entirety was forwarded to the Provider who requested this consultation.  Subjective:   SuMckynna Vanloans a [age].o. female presenting today for evaluation of  Chief Complaint  Patient presents with  . Asthma  . Allergic Rhinitis     SuSeleni Melleras a history of the following: Patient Active Problem List   Diagnosis Date Noted  . Acute cystitis 10/04/2020  . Shortness of breath 06/15/2020  . Other forms of systemic lupus erythematosus ([place]  . History of colonic polyps   . Avascular necrosis of bone ([facility] [place]11/18/2019  . Pain in joint of right hip 09/02/2018  . 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 Swallowing difficulty 05/30/2016  . Choking episode occurring both during daytime and at night 05/30/2016  . Muscle spasm 02/10/2014  . Fibromyalgia 02/10/2014    History obtained from: chart review and patient.  SuRoslyn Smilingas referred by GeElby BeckFNP (Inactive).     SuMercedes a [age].o. female presenting for an evaluation of allergies and asthma.   Asthma/Respiratory Symptom History: She reports that she "breathes like a cow". She says that people can hear her breathe across the room. She has been on a couple of inhalers,  but her "VA" inhaler has expired. She tells me that the [place] has "dropped" her". She has a history of cardiac disease and her cardiologist gave her a prescription for an inhaler. She does have a spacer for her inhaler. She apparently lost her inhaler when she was on a vacation. It looks like she was on Symbicort at  one point and she also has Breztri in the list of medications.   Allergic Rhinitis Symptom History: She has had allergies since she was stationed in [place]. She reports that she was allergic to [place] pine trees. She is currently taking an allergy pill and a nose spray for her symptoms now. She is having a constant drip down her throat. She has not had success with treating this. She has tried tea and honey. Symptoms happen throughout the year but it does get worse during the spring time. This has been particularly bad. The combination of wins and dampness seems to make it worse.  She was on shorts when she was on [place]. She did these for 4 years. She did have allergy testing done by Dr. June Leap, but she was never on shots.   She is on Plaquenil for her lupus. She was on MTX for the arthritis pain. She is on a different pill for this now. She is no longer on the MTX and the folic acid.   Otherwise, there is no history of other atopic diseases, including food allergies, drug allergies, stinging insect allergies, eczema, urticaria or contact dermatitis. There is no significant infectious history. Vaccinations are up to date.    Past Medical History: Patient Active Problem List   Diagnosis Date Noted  . Acute cystitis 10/04/2020  . Shortness of breath 06/15/2020  . Other forms of systemic lupus erythematosus ([place]) 04/27/2020  . History of colonic polyps   . Avascular necrosis of bone ([place]) 11/17/2018  . Pain in joint of right hip 09/02/2018  . 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 Swallowing difficulty 05/30/2016  . Choking episode occurring both during daytime and at night 05/30/2016  . Muscle spasm 02/10/2014  . Fibromyalgia 02/10/2014    Medication List:  Allergies as of 05/09/2021      Reactions   Ampicillin Other (See Comments)   Pt had blisters all over her body Has patient had a PCN reaction causing immediate rash, facial/tongue/throat swelling, SOB or lightheadedness with hypotension:  no Has patient had a PCN reaction causing severe rash involving mucus membranes or skin necrosis: yes Has patient had a PCN reaction that required hospitalization: no Has patient had a PCN reaction occurring within the last 10 years: no If all of the above answers are "NO", then may proceed with Cephalosporin use. Pt had blisters all over her body Has patient had a PCN reaction causing immediate rash, facial/tongue/throat swelling, SOB or lightheadedness with hypotension: no Has patient had a PCN reaction causing severe rash involving mucus membranes or skin necrosis: yes Has patient had a PCN reaction that required hospitalization: no Has patient had a PCN reaction occurring within the last 10 years: no If all of the above answers are "NO", then may proceed with Cephalosporin use.   Methocarbamol Other (See Comments)   Prednisone Other (See Comments)   Sulfa Antibiotics Other (See Comments)   Amoxicillin Rash   Minocycline Rash      Medication List       Accurate as of May 09, 2021 11:24 AM. If you have any questions, ask your nurse or doctor.  ACIDOPHILUS LACTOBACILLUS PO Take 2 tablets by mouth daily.   aspirin 81 MG tablet Take 81 mg by mouth daily.   azelastine 0.1 % nasal spray Commonly known as: ASTELIN azelastine 137 mcg (0.1 %) nasal spray aerosol  USE 1 SPRAY IN EACH NOSTRIL TWICE DAILY   Breztri Aerosphere 160-9-4.8 MCG/ACT Aero Generic drug: Budeson-Glycopyrrol-Formoterol Inhale 2 puffs into the lungs 2 (two) times daily.   budesonide-formoterol 160-4.5 MCG/ACT inhaler Commonly known as: SYMBICORT Inhale 2 puffs into the lungs in the morning and at bedtime.   Cholecalciferol 250 MCG (10000 UT) Tabs Take 1,000 Units by mouth daily.   clobetasol 0.05 % external solution Commonly known as: TEMOVATE APPLY SMALL AMOUNT TO AFFECTED AREA DAILY AS NEEDED - RINSE IN SCALP. NEVER TO FACE.   Dexilant 60 MG capsule Generic drug: dexlansoprazole Take 1  capsule (60 mg total) by mouth daily.   dicyclomine 10 MG capsule Commonly known as: BENTYL Take 10 mg by mouth 4 (four) times daily -  before meals and at bedtime.   docusate sodium 100 MG capsule Commonly known as: COLACE Take 100 mg by mouth 2 (two) times daily.   famotidine 40 MG tablet Commonly known as: PEPCID Take 40 mg by mouth daily.   Fish Oil 1000 MG Caps Take 1 capsule by mouth daily.   fluocinonide 0.05 % external solution Commonly known as: LIDEX APPLY SMALL AMOUNT TO AFFECTED AREA DAILY AS NEEDED   folic acid 1 MG tablet Commonly known as: FOLVITE Take 1 mg by mouth daily.   guaifenesin 400 MG Tabs tablet Commonly known as: HUMIBID E Take 1 tablet (400 mg total) by mouth 2 (two) times daily.   hydroxychloroquine 200 MG tablet Commonly known as: PLAQUENIL Take 1 tablet daily and 2 tablets every other day.   IPRATROPIUM BROMIDE NA Place 1 spray into the nose in the morning and at bedtime.   ketotifen 0.025 % ophthalmic solution Commonly known as: ZADITOR INSTILL 1 DROP IN BOTH EYES TWICE A DAY AS NEEDED   loratadine 10 MG tablet Commonly known as: CLARITIN Take 10 mg by mouth daily.   methotrexate 2.5 MG tablet Take 12.5 mg by mouth once a week. Caution: Chemotherapy. Protect from light.   metoprolol tartrate 25 MG tablet Commonly known as: LOPRESSOR Take 0.5 tablets (12.5 mg total) by mouth daily.   montelukast 10 MG tablet Commonly known as: SINGULAIR Take 10 mg by mouth at bedtime.   nortriptyline 25 MG capsule Commonly known as: PAMELOR 1 tab in am, 2 tabs at HS   omeprazole 20 MG capsule Commonly known as: PRILOSEC Take 20 mg by mouth 2 (two) times daily.   pantoprazole 40 MG tablet Commonly known as: PROTONIX Take 40 mg by mouth 2 (two) times daily.   PROAIR HFA IN Inhale 2 puffs into the lungs every 6 (six) hours as needed.   triamterene-hydrochlorothiazide 37.5-25 MG tablet Commonly known as: MAXZIDE-25 Take 0.5 tablets by  mouth daily.   vitamin E 180 MG (400 UNITS) capsule Take 800 Units by mouth daily.       Birth History: non-contributory  Developmental History: non-contributory  Past Surgical History: Past Surgical History:  Procedure Laterality Date  . COLONOSCOPY    . COLONOSCOPY WITH PROPOFOL N/A 06/30/2019   Procedure: COLONOSCOPY WITH PROPOFOL;  Surgeon: Jonathon Bellows, MD;  Location: [facility] ENDOSCOPY;  Service: Gastroenterology;  Laterality: N/A;  . ESOPHAGOGASTRODUODENOSCOPY       Family History: Family History  Problem Relation Age of Onset  .  Hypertension Father      Social History: Ernest lives at home with her husband and her daughter.  She lives in a [place] that is 71 years old.  There is wood flooring throughout.  They have 1 dog.  There are no roaches.  There is no mildew.  They have dust mite covers on the pillows, but not the bed.  There is no tobacco exposure.  She is currently retired.  She is not exposed to fumes, chemicals, or dust. She was in [employer] in four years. After that, she graduated from [employer] and she met her husband who was a Engineer, building services. She has had a number of jobs throughout the years.    Review of Systems  Constitutional: Negative.  Negative for fever, malaise/fatigue and weight loss.  HENT: Positive for congestion. Negative for ear discharge and ear pain.        Positive for postnasal drip.   Eyes: Negative for pain, discharge and redness.  Respiratory: Positive for cough and shortness of breath. Negative for sputum production and wheezing.   Cardiovascular: Negative.  Negative for chest pain and palpitations.  Gastrointestinal: Negative for abdominal pain, constipation, diarrhea, heartburn, nausea and vomiting.  Skin: Negative.  Negative for itching and rash.  Neurological: Negative for dizziness and headaches.  Endo/Heme/Allergies: Negative for environmental allergies. Does not bruise/bleed easily.       Objective:   Blood pressure  130/78, pulse 89, temperature 98.2 F (36.8 C), temperature source Temporal, resp. rate 16, height 5' 5.5" (1.664 m), weight 154 lb 9.6 oz (70.1 kg), SpO2 98 %. Body mass index is 25.34 kg/m.   Physical Exam:   Physical Exam Constitutional:      Appearance: She is well-developed.  HENT:     Head: Normocephalic and atraumatic.     Right Ear: Tympanic membrane, ear canal and external ear normal. No drainage, swelling or tenderness. Tympanic membrane is not injected, scarred, erythematous, retracted or bulging.     Left Ear: Tympanic membrane, ear canal and external ear normal. No drainage, swelling or tenderness. Tympanic membrane is not injected, scarred, erythematous, retracted or bulging.     Nose: No nasal deformity, septal deviation, mucosal edema or rhinorrhea.     Right Turbinates: Enlarged, swollen and pale.     Left Turbinates: Enlarged, swollen and pale.     Right Sinus: No maxillary sinus tenderness or frontal sinus tenderness.     Left Sinus: No maxillary sinus tenderness or frontal sinus tenderness.     Comments: No nasal polyps noted.    Mouth/Throat:     Mouth: Mucous membranes are not pale and not dry.     Pharynx: Uvula midline.  Eyes:     General: Lids are normal. Allergic shiner present.        Right eye: No discharge.        Left eye: No discharge.     Conjunctiva/sclera: Conjunctivae normal.     Right eye: Right conjunctiva is not injected. No chemosis.    Left eye: Left conjunctiva is not injected. No chemosis.    Pupils: Pupils are equal, round, and reactive to light.  Cardiovascular:     Rate and Rhythm: Normal rate and regular rhythm.     Heart sounds: Normal heart sounds.  Pulmonary:     Effort: Pulmonary effort is normal. No tachypnea, accessory muscle usage or respiratory distress.     Breath sounds: Normal breath sounds. No wheezing, rhonchi or rales.  Comments: Moving air well in all lung fields. No increased work of breathing noted.  Chest:      Chest wall: No tenderness.  Abdominal:     Tenderness: There is no abdominal tenderness. There is no guarding or rebound.  Lymphadenopathy:     Head:     Right side of head: No submandibular, tonsillar or occipital adenopathy.     Left side of head: No submandibular, tonsillar or occipital adenopathy.     Cervical: No cervical adenopathy.  Skin:    General: Skin is warm.     Capillary Refill: Capillary refill takes less than 2 seconds.     Coloration: Skin is not pale.     Findings: No abrasion, erythema, petechiae or rash. Rash is not papular, urticarial or vesicular.     Comments: No eczematous or urticarial lesions noted.   Neurological:     Mental Status: She is alert.  Psychiatric:        Behavior: Behavior is cooperative.      Diagnostic studies:    Spirometry: results abnormal (FEV1: 1.11/56%, FVC: 2.52/98%, FEV1/FVC: 44%).    Spirometry consistent with severe obstructive disease. Xopenex four puffs via MDI treatment given in clinic with significant improvement in FEV1 per ATS criteria. FEV1 increased from 56% expected to 101% expected.   Allergy Studies:     Airborne Adult Perc - 05/09/21 1007    Time Antigen Placed 1007    Allergen Manufacturer Greer    Location Back    Number of Test -58    Panel 1 Select    1. Control-Buffer 50% Glycerol Negative    2. Control-Histamine 1 mg/ml 2+    3. Albumin saline Negative    4. Draper Negative    5. [place] Negative    6. Johnson Negative    7. [place] Blue Negative    8. Meadow Fescue Negative    9. Perennial Rye Negative    10. Sweet Vernal Negative    11. Timothy Negative    12. Cocklebur Negative    13. Burweed Marshelder Negative    14. Ragweed, short Negative    15. Ragweed, Giant Negative    16. Plantain,  English Negative    17. Lamb's Quarters Negative    18. Sheep Sorrell Negative    19. Rough Pigweed Negative    20. Marsh Elder, Rough Negative    21. Mugwort, Common Negative    22. Ash mix Negative     23. Birch mix Negative    24. Beech American Negative    25. Box, Elder Negative    26. Cedar, red 2+    27. Cottonwood, [place] Negative    28. Elm mix Negative    29. Hickory 3+    30. Maple mix 2+    31. Oak, [place] mix 2+    32. Pecan Pollen 3+    33. Pine mix Negative    34. Sycamore Eastern Negative    35. [place], Black Pollen Negative    36. Alternaria alternata Negative    37. Cladosporium Herbarum Negative    38. Aspergillus mix Negative    39. Penicillium mix Negative    40. Bipolaris sorokiniana (Helminthosporium) Negative    41. Drechslera spicifera (Curvularia) Negative    42. Mucor plumbeus Negative    43. Fusarium moniliforme Negative    44. Aureobasidium pullulans (pullulara) Negative    45. Rhizopus oryzae Negative    46. Botrytis cinera Negative    47. Epicoccum  nigrum Negative    48. Phoma betae Negative    49. Candida Albicans Negative    50. Trichophyton mentagrophytes Negative    51. Mite, D Farinae  5,000 AU/ml Negative    52. Mite, D Pteronyssinus  5,000 AU/ml Negative    53. Cat Hair 10,000 BAU/ml Negative    54.  Dog Epithelia Negative    55. Mixed Feathers Negative    56. Horse Epithelia Negative    57. Cockroach, German Negative    58. Mouse Negative    59. Tobacco Leaf Negative          Intradermal - 05/09/21 1120    Time Antigen Placed 1100    Allergen Manufacturer Greer    Location Arm    Number of Test 14    Control Negative    [place] Negative    Johnson 3+    7 Grass 4+    Ragweed mix 4+    Weed mix 2+    Mold 1 Negative    Mold 2 Negative    Mold 3 Negative    Mold 4 Negative    Cat Negative    Dog Negative    Cockroach Negative    Mite mix 1+           Allergy testing results were read and interpreted by myself, documented by clinical staff.         Salvatore Marvel, MD [facility]

## 2021-05-09 NOTE — Progress Notes (Signed)
VIALS EXP 05-09-22 

## 2021-05-09 NOTE — Progress Notes (Signed)
Aeroallergen Immunotherapy   Ordering Provider: Dr. Malachi Bonds   Patient Details  Name: Tracie Barnes  MRN: [id number]  Date of Birth: October 31, 1951   Order 1 of 1   Vial Label: G/W/T/DM   0.3 ml (Volume) BAU Concentration -- 7 Grass Mix* 100,000 ([address] [place], [place], [place], [place] Rye, RedTop, Sweet Vernal, Timothy)  0.2 ml (Volume) 1:20 Concentration -- Johnson  0.3 ml (Volume) 1:20 Concentration -- Ragweed Mix  0.5 ml (Volume) 1:20 Concentration -- Weed Mix*  0.2 ml (Volume) 1:10 Concentration -- Cedar, red  0.2 ml (Volume) 1:10 Concentration -- Hickory*  0.2 ml (Volume) 1:20 Concentration -- Maple Mix*  0.3 ml (Volume) 1:10 Concentration -- Oak, [place] mix*  0.2 ml (Volume) 1:10 Concentration -- Pecan Pollen  0.5 ml (Volume)  AU Concentration -- Mite Mix (DF 5,000 & DP 5,000)    2.9 ml Extract Subtotal  1.1 ml Diluent  5.0 ml Maintenance Total   Schedule: A   Blue Vial (1:100,000): Schedule A (10 doses)  Yellow Vial (1:10,000): Schedule A (10 doses)  Green Vial (1:1,000): Schedule A (10 doses)  Red Vial (1:100): Schedule A (10 doses)   Special Instructions: none

## 2021-05-09 NOTE — Patient Instructions (Addendum)
1. Moderate persistent asthma, uncomplicated - Lung testing was in the 50% range, but it improved to 100% with the bronchodilator (albuterol) treatment. - This certainly points towards asthma. - I think you need to take a daily controller medication but the best management of your symptoms. - Spacer sample and demonstration provided. - Daily controller medication(s): Symbicort 160/4.705mcg two puffs twice daily with spacer - Prior to physical activity: albuterol 2 puffs 10-15 minutes before physical activity. - Rescue medications: albuterol 4 puffs every 4-6 hours as needed - Asthma control goals:  * Full participation in all desired activities (may need albuterol before activity) * Albuterol use two time or less a week on average (not counting use with activity) * Cough interfering with sleep two time or less a month * Oral steroids no more than once a year * No hospitalizations  2. Seasonal and perennial allergic rhinitis - Testing today showed: grasses, ragweed, weeds, trees and dust mites - Copy of test results provided.  - Avoidance measures provided. - Continue with: Zyrtec (cetirizine) 10mg  tablet once daily and Flonase (fluticasone) one spray per nostril daily - Start taking: Astelin (azelastine) 2 sprays per nostril 1-2 times daily as needed - You can use an extra dose of the antihistamine, if needed, for breakthrough symptoms.  - Consider nasal saline rinses 1-2 times daily to remove allergens from the nasal cavities as well as help with mucous clearance (this is especially helpful to do before the nasal sprays are given) - Consider allergy shots as a means of long-term control. - Allergy shots "re-train" and "reset" the immune system to ignore environmental allergens and decrease the resulting immune response to those allergens (sneezing, itchy watery eyes, runny nose, nasal congestion, etc).    - Allergy shots improve symptoms in 75-85% of patients.  - Let us know if you want to  start allergy shots.   3. Return in about 2 months (around 07/09/2021).    Please inform us of any Emergency Department visits, hospitalizations, or changes in symptoms. Call us before going to the ED for breathing or allergy symptoms since we might be able to fit you in for a sick visit. Feel free to contact us anytime with any questions, problems, or concerns.  It was a pleasure to meet you today!  Websites that have reliable patient information: 1. American Academy of Asthma, Allergy, and Immunology: www.aaaai.org 2. Food Allergy Research and Education (FARE): foodallergy.org 3. Mothers of Asthmatics: http://www.asthmacommunitynetwork.org 4. American College of Allergy, Asthma, and Immunology: www.acaai.org   COVID-19 Vaccine Information can be found at: [email or web address] For questions related to vaccine distribution or appointments, please email [email or web address] or call [phone].   We realize that you might be concerned about having an allergic reaction to the COVID19 vaccines. To help with that concern, WE ARE OFFERING THE COVID19 VACCINES IN OUR OFFICE! Ask the front desk for dates!     "Like" us on Facebook and Instagram for our latest updates!      A healthy democracy works best when [employer] voters participate! Make sure you are registered to vote! If you have moved or changed any of your contact information, you will need to get this updated before voting!  In some cases, you MAY be able to register to vote online: [email or web address]      1. Control-Buffer 50% Glycerol Negative   2. Control-Histamine 1 mg/ml 2+   3. Albumin saline Negative   4. Bahia Negative   5. [place] Negative   6. Johnson Negative  7. [facility] Negative   8. Meadow Fescue Negative   9. Perennial Rye Negative   10. Sweet Vernal Negative   11. Timothy Negative   12. Cocklebur Negative   13.  Burweed Marshelder Negative   14. Ragweed, short Negative   15. Ragweed, Giant Negative   16. Plantain,  English Negative   17. Lamb's Quarters Negative   18. Sheep Sorrell Negative   19. Rough Pigweed Negative   20. Marsh Elder, Rough Negative   21. Mugwort, Common Negative   22. Ash mix Negative   23. Birch mix Negative   24. Beech American Negative   25. Box, Elder Negative   26. Cedar, red 2+   27. Cottonwood, Guinea[place]issau Negative   28. Elm mix Negative   29. Hickory 3+   30. Maple mix 2+   31. Oak, [place] mix 2+   32. Pecan Pollen 3+   33. Pine mix Negative   34. Sycamore Eastern Negative   35. Walnut, Black Pollen Negative   36. Alternaria alternata Negative   37. Cladosporium Herbarum Negative   38. Aspergillus mix Negative   39. Penicillium mix Negative   40. Bipolaris sorokiniana (Helminthosporium) Negative   41. Drechslera spicifera (Curvularia) Negative   42. Mucor plumbeus Negative   43. Fusarium moniliforme Negative   44. Aureobasidium pullulans (pullulara) Negative   45. Rhizopus oryzae Negative   46. Botrytis cinera Negative   47. Epicoccum nigrum Negative   48. Phoma betae Negative   49. Candida Albicans Negative   50. Trichophyton mentagrophytes Negative   51. Mite, D Farinae  5,000 AU/ml Negative   52. Mite, D Pteronyssinus  5,000 AU/ml Negative   53. Cat Hair 10,000 BAU/ml Negative   54.  Dog Epithelia Negative   55. Mixed Feathers Negative   56. Horse Epithelia Negative   57. Cockroach, German Negative   58. Mouse Negative   59. Tobacco Leaf Negative     Control Negative   [place] Negative   Johnson 3+   7 Grass 4+   Ragweed mix 4+   Weed mix 2+   Mold 1 Negative   Mold 2 Negative   Mold 3 Negative   Mold 4 Negative   Cat Negative   Dog Negative   Cockroach Negative   Mite mix 1+     Reducing Pollen Exposure  The American Academy of Allergy, Asthma and Immunology suggests the following steps to reduce your exposure to pollen during  allergy seasons.    1. Do not hang sheets or clothing out to dry; pollen may collect on these items. 2. Do not mow lawns or spend time around freshly cut grass; mowing stirs up pollen. 3. Keep windows closed at night.  Keep car windows closed while driving. 4. Minimize morning activities outdoors, a time when pollen counts are usually at their highest. 5. Stay indoors as much as possible when pollen counts or humidity is high and on windy days when pollen tends to remain in the air longer. 6. Use air conditioning when possible.  Many air conditioners have filters that trap the pollen spores. 7. Use a HEPA room air filter to remove pollen form the indoor air you breathe.  Control of Dust Mite Allergen    Dust mites play a major role in allergic asthma and rhinitis.  They occur in environments with high humidity wherever human skin is found.  Dust mites absorb humidity from the atmosphere (ie, they do not drink) and feed on organic  matter (including shed human and animal skin).  Dust mites are a microscopic type of insect that you cannot see with the naked eye.  High levels of dust mites have been detected from mattresses, pillows, carpets, upholstered furniture, bed covers, clothes, soft toys and any woven material.  The principal allergen of the dust mite is found in its feces.  A gram of dust may contain 1,000 mites and 250,000 fecal particles.  Mite antigen is easily measured in the air during house cleaning activities.  Dust mites do not bite and do not cause harm to humans, other than by triggering allergies/asthma.    Ways to decrease your exposure to dust mites in your home:  1. Encase mattresses, box springs and pillows with a mite-impermeable barrier or cover   2. Wash sheets, blankets and drapes weekly in hot water (130 F) with detergent and dry them in a dryer on the hot setting.  3. Have the room cleaned frequently with a vacuum cleaner and a damp dust-mop.  For carpeting or rugs,  vacuuming with a vacuum cleaner equipped with a high-efficiency particulate air (HEPA) filter.  The dust mite allergic individual should not be in a room which is being cleaned and should wait 1 hour after cleaning before going into the room. 4. Do not sleep on upholstered furniture (eg, couches).   5. If possible removing carpeting, upholstered furniture and drapery from the home is ideal.  Horizontal blinds should be eliminated in the rooms where the person spends the most time (bedroom, study, television room).  Washable vinyl, roller-type shades are optimal. 6. Remove all non-washable stuffed toys from the bedroom.  Wash stuffed toys weekly like sheets and blankets above.   7. Reduce indoor humidity to less than 50%.  Inexpensive humidity monitors can be purchased at most hardware stores.  Do not use a humidifier as can make the problem worse and are not recommended.  Allergy Shots   Allergies are the result of a chain reaction that starts in the immune system. Your immune system controls how your body defends itself. For instance, if you have an allergy to pollen, your immune system identifies pollen as an invader or allergen. Your immune system overreacts by producing antibodies called Immunoglobulin E (IgE). These antibodies travel to cells that release chemicals, causing an allergic reaction.  The concept behind allergy immunotherapy, whether it is received in the form of shots or tablets, is that the immune system can be desensitized to specific allergens that trigger allergy symptoms. Although it requires time and patience, the payback can be long-term relief.  How Do Allergy Shots Work?  Allergy shots work much like a vaccine. Your body responds to injected amounts of a particular allergen given in increasing doses, eventually developing a resistance and tolerance to it. Allergy shots can lead to decreased, minimal or no allergy symptoms.  There generally are two phases: build-up and  maintenance. Build-up often ranges from three to six months and involves receiving injections with increasing amounts of the allergens. The shots are typically given once or twice a week, though more rapid build-up schedules are sometimes used.  The maintenance phase begins when the most effective dose is reached. This dose is different for each person, depending on how allergic you are and your response to the build-up injections. Once the maintenance dose is reached, there are longer periods between injections, typically two to four weeks.  Occasionally doctors give cortisone-type shots that can temporarily reduce allergy symptoms. These types of  shots are different and should not be confused with allergy immunotherapy shots.  Who Can Be Treated with Allergy Shots?  Allergy shots may be a good treatment approach for people with allergic rhinitis (hay fever), allergic asthma, conjunctivitis (eye allergy) or stinging insect allergy.   Before deciding to begin allergy shots, you should consider:  . The length of allergy season and the severity of your symptoms . Whether medications and/or changes to your environment can control your symptoms . Your desire to avoid long-term medication use . Time: allergy immunotherapy requires a major time commitment . Cost: may vary depending on your insurance coverage  Allergy shots for children age 52 and older are effective and often well tolerated. They might prevent the onset of new allergen sensitivities or the progression to asthma.  Allergy shots are not started on patients who are pregnant but can be continued on patients who become pregnant while receiving them. In some patients with other medical conditions or who take certain common medications, allergy shots may be of risk. It is important to mention other medications you talk to your allergist.   When Will I Feel Better?  Some may experience decreased allergy symptoms during the build-up  phase. For others, it may take as long as 12 months on the maintenance dose. If there is no improvement after a year of maintenance, your allergist will discuss other treatment options with you.  If you aren't responding to allergy shots, it may be because there is not enough dose of the allergen in your vaccine or there are missing allergens that were not identified during your allergy testing. Other reasons could be that there are high levels of the allergen in your environment or major exposure to non-allergic triggers like tobacco smoke.  What Is the Length of Treatment?  Once the maintenance dose is reached, allergy shots are generally continued for three to five years. The decision to stop should be discussed with your allergist at that time. Some people may experience a permanent reduction of allergy symptoms. Others may relapse and a longer course of allergy shots can be considered.  What Are the Possible Reactions?  The two types of adverse reactions that can occur with allergy shots are local and systemic. Common local reactions include very mild redness and swelling at the injection site, which can happen immediately or several hours after. A systemic reaction, which is less common, affects the entire body or a particular body system. They are usually mild and typically respond quickly to medications. Signs include increased allergy symptoms such as sneezing, a stuffy nose or hives.  Rarely, a serious systemic reaction called anaphylaxis can develop. Symptoms include swelling in the throat, wheezing, a feeling of tightness in the chest, nausea or dizziness. Most serious systemic reactions develop within 30 minutes of allergy shots. This is why it is strongly recommended you wait in your doctor's office for 30 minutes after your injections. Your allergist is trained to watch for reactions, and his or her staff is trained and equipped with the proper medications to identify and treat  them.  Who Should Administer Allergy Shots?  The preferred location for receiving shots is your prescribing allergist's office. Injections can sometimes be given at another facility where the physician and staff are trained to recognize and treat reactions, and have received instructions by your prescribing allergist.

## 2021-05-09 NOTE — Addendum Note (Signed)
Addended by: Orson Aloe on: 05/09/2021 05:19 PM   Modules accepted: Orders

## 2021-05-12 ENCOUNTER — Other Ambulatory Visit: Payer: Self-pay

## 2021-05-12 MED ORDER — BUDESONIDE-FORMOTEROL FUMARATE 160-4.5 MCG/ACT IN AERO: 2.0000 | INHALATION_SPRAY | Freq: Two times a day (BID) | RESPIRATORY_TRACT | 5 refills | Status: DC

## 2021-05-30 ENCOUNTER — Ambulatory Visit (INDEPENDENT_AMBULATORY_CARE_PROVIDER_SITE_OTHER): Payer: No Typology Code available for payment source | Admitting: *Deleted

## 2021-05-30 ENCOUNTER — Other Ambulatory Visit: Payer: Self-pay

## 2021-05-30 DIAGNOSIS — J309 Allergic rhinitis, unspecified: Secondary | ICD-10-CM | POA: Diagnosis not present

## 2021-05-30 MED ORDER — EPINEPHRINE 0.3 MG/0.3ML IJ SOAJ: 0.3000 mg | Freq: Once | INTRAMUSCULAR | 1 refills | Status: AC

## 2021-05-30 NOTE — Progress Notes (Signed)
Immunotherapy   Patient Details  Name: Tracie Barnes MRN: [id number] Date of Birth: Jul 19, 1951  05/30/2021  Tracie Barnes started injections for  G-W-T-DM Following schedule: A  Frequency:1 time per week Epi-Pen:Epi-Pen Available  Consent signed and patient instructions given. Patient started allergy injection today and received 0.26mL of G-W-T-DM in the RUA. Patient waited 30 minutes and did not experience any issues.   Hiromi Knodel Fernandez-Vernon 05/30/2021, 3:40 PM

## 2021-06-07 ENCOUNTER — Ambulatory Visit (INDEPENDENT_AMBULATORY_CARE_PROVIDER_SITE_OTHER): Payer: No Typology Code available for payment source

## 2021-06-07 DIAGNOSIS — J309 Allergic rhinitis, unspecified: Secondary | ICD-10-CM | POA: Diagnosis not present

## 2021-06-08 ENCOUNTER — Telehealth: Payer: Self-pay | Admitting: *Deleted

## 2021-06-08 NOTE — Telephone Encounter (Signed)
Tracie Barnes left voicemail on triage stating she has a positive home Covid test and would like to get prescription for treatment.  Left message for [name] to return call so we can get some additional information.

## 2021-06-12 NOTE — Telephone Encounter (Addendum)
Tracie Barnes did not return call to office. It appears patient was seen at [facility] on 06/08/2021 and tested negative for Covid. Per Care Everywhere.

## 2021-06-14 ENCOUNTER — Ambulatory Visit (INDEPENDENT_AMBULATORY_CARE_PROVIDER_SITE_OTHER): Payer: No Typology Code available for payment source

## 2021-06-14 ENCOUNTER — Encounter: Payer: Self-pay | Admitting: Allergy & Immunology

## 2021-06-14 DIAGNOSIS — J309 Allergic rhinitis, unspecified: Secondary | ICD-10-CM

## 2021-06-21 ENCOUNTER — Ambulatory Visit (INDEPENDENT_AMBULATORY_CARE_PROVIDER_SITE_OTHER): Payer: No Typology Code available for payment source

## 2021-06-21 DIAGNOSIS — J309 Allergic rhinitis, unspecified: Secondary | ICD-10-CM | POA: Diagnosis not present

## 2021-07-04 ENCOUNTER — Ambulatory Visit (INDEPENDENT_AMBULATORY_CARE_PROVIDER_SITE_OTHER): Payer: No Typology Code available for payment source | Admitting: *Deleted

## 2021-07-04 DIAGNOSIS — J309 Allergic rhinitis, unspecified: Secondary | ICD-10-CM | POA: Diagnosis not present

## 2021-07-14 ENCOUNTER — Ambulatory Visit (INDEPENDENT_AMBULATORY_CARE_PROVIDER_SITE_OTHER): Payer: No Typology Code available for payment source

## 2021-07-14 DIAGNOSIS — J309 Allergic rhinitis, unspecified: Secondary | ICD-10-CM

## 2021-07-18 ENCOUNTER — Other Ambulatory Visit: Payer: Self-pay

## 2021-07-18 ENCOUNTER — Ambulatory Visit (INDEPENDENT_AMBULATORY_CARE_PROVIDER_SITE_OTHER): Payer: No Typology Code available for payment source | Admitting: Allergy & Immunology

## 2021-07-18 ENCOUNTER — Encounter: Payer: Self-pay | Admitting: Allergy & Immunology

## 2021-07-18 VITALS — BP 110/70 | HR 82 | Temp 97.3°F | Resp 16

## 2021-07-18 DIAGNOSIS — J4540 Moderate persistent asthma, uncomplicated: Secondary | ICD-10-CM

## 2021-07-18 DIAGNOSIS — R198 Other specified symptoms and signs involving the digestive system and abdomen: Secondary | ICD-10-CM

## 2021-07-18 DIAGNOSIS — K219 Gastro-esophageal reflux disease without esophagitis: Secondary | ICD-10-CM | POA: Diagnosis not present

## 2021-07-18 DIAGNOSIS — J309 Allergic rhinitis, unspecified: Secondary | ICD-10-CM

## 2021-07-18 DIAGNOSIS — J302 Other seasonal allergic rhinitis: Secondary | ICD-10-CM

## 2021-07-18 DIAGNOSIS — J3089 Other allergic rhinitis: Secondary | ICD-10-CM

## 2021-07-18 DIAGNOSIS — R0989 Other specified symptoms and signs involving the circulatory and respiratory systems: Secondary | ICD-10-CM

## 2021-07-18 MED ORDER — FLUTICASONE PROPIONATE 50 MCG/ACT NA SUSP: 1.0000 | Freq: Every day | NASAL | 5 refills | Status: DC

## 2021-07-18 MED ORDER — CETIRIZINE HCL 10 MG PO TABS: 10.0000 mg | ORAL_TABLET | Freq: Every day | ORAL | 5 refills | Status: DC

## 2021-07-18 MED ORDER — ALBUTEROL SULFATE HFA 108 (90 BASE) MCG/ACT IN AERS: 4.0000 | INHALATION_SPRAY | Freq: Four times a day (QID) | RESPIRATORY_TRACT | 1 refills | Status: DC | PRN

## 2021-07-18 MED ORDER — BUDESONIDE-FORMOTEROL FUMARATE 160-4.5 MCG/ACT IN AERO: 2.0000 | INHALATION_SPRAY | Freq: Two times a day (BID) | RESPIRATORY_TRACT | 5 refills | Status: DC

## 2021-07-18 MED ORDER — AZELASTINE HCL 0.1 % NA SOLN: 1.0000 | Freq: Two times a day (BID) | NASAL | 5 refills | Status: DC

## 2021-07-18 NOTE — Patient Instructions (Addendum)
1. Moderate persistent asthma, uncomplicated - Lung testing looked much better. - Continue to do good work with your breathing! - Daily controller medication(s): Symbicort 160/4.51mcg two puffs twice daily with spacer - Prior to physical activity: albuterol 2 puffs 10-15 minutes before physical activity. - Rescue medications: albuterol 4 puffs every 4-6 hours as needed - Asthma control goals:  * Full participation in all desired activities (may need albuterol before activity) * Albuterol use two time or less a week on average (not counting use with activity) * Cough interfering with sleep two time or less a month * Oral steroids no more than once a year * No hospitalizations  2. Seasonal and perennial allergic rhinitis (grasses, ragweed, weeds, trees and dust mites) - Continue with allergy shots at the same schedule.  - Continue with: Zyrtec (cetirizine) 10mg  tablet once daily and Flonase (fluticasone) one spray per nostril daily - Continue taking: Astelin (azelastine) 2 sprays per nostril 1-2 times daily as needed - Samples of Mucinex provided to help clear up that mucous plug in your throat.   3. Return in about 6 months (around 01/18/2022).    Please inform 01/20/2022 of any Emergency Department visits, hospitalizations, or changes in symptoms. Call us before going to the ED for breathing or allergy symptoms since we might be able to fit you in for a sick visit. Feel free to contact us anytime with any questions, problems, or concerns.  It was a pleasure to see you again today!  Websites that have reliable patient information: 1. American Academy of Asthma, Allergy, and Immunology: www.aaaai.org 2. Food Allergy Research and Education (FARE): foodallergy.org 3. Mothers of Asthmatics: http://www.asthmacommunitynetwork.org 4. American College of Allergy, Asthma, and Immunology: www.acaai.org   COVID-19 Vaccine Information can be found at:  [place] For questions related to vaccine distribution or appointments, please email [email or web address] or call [phone].   We realize that you might be concerned about having an allergic reaction to the COVID19 vaccines. To help with that concern, WE ARE OFFERING THE COVID19 VACCINES IN OUR OFFICE! Ask the front desk for dates!     "Like" [phone] on Facebook and Instagram for our latest updates!      A healthy democracy works best when [place] participate! Make sure you are registered to vote! If you have moved or changed any of your contact information, you will need to get this updated before voting!  In some cases, you MAY be able to register to vote online: [employer]     ATTENTION [place] RESIDENTS! CITY ELECTIONS ARE GOING ON NOW! Election day is Tuesday July 26th!

## 2021-07-18 NOTE — Progress Notes (Signed)
FOLLOW UP  Date of Service/Encounter:  07/18/21   Assessment:   Moderate persistent asthma, uncomplicated   Seasonal and perennial allergic rhinitis (grasses, ragweed, weeds, trees and dust mites) - on allergen immunotherapy   Globus sensation - mucous plug?  GERD - on PPI  Lupus  Plan/Recommendations:   1. Moderate persistent asthma, uncomplicated - Lung testing looked much better. - Continue to do good work with your breathing! - Daily controller medication(s): Symbicort 160/4.25mcg two puffs twice daily with spacer - Prior to physical activity: albuterol 2 puffs 10-15 minutes before physical activity. - Rescue medications: albuterol 4 puffs every 4-6 hours as needed - Asthma control goals:  * Full participation in all desired activities (may need albuterol before activity) * Albuterol use two time or less a week on average (not counting use with activity) * Cough interfering with sleep two time or less a month * Oral steroids no more than once a year * No hospitalizations  2. Seasonal and perennial allergic rhinitis (grasses, ragweed, weeds, trees and dust mites) - Continue with allergy shots at the same schedule.  - Continue with: Zyrtec (cetirizine) 10mg  tablet once daily and Flonase (fluticasone) one spray per nostril daily - Continue taking: Astelin (azelastine) 2 sprays per nostril 1-2 times daily as needed - Samples of Mucinex provided to help clear up that mucous plug in your throat.   3. Return in about 6 months (around 01/18/2022).    Subjective:   Tracie Barnes is a 70 y.o. female presenting today for follow up of  Chief Complaint  Patient presents with   Allergic Rhinitis     Tracie Barnes has a history of the following: Patient Active Problem List   Diagnosis Date Noted   Seasonal and perennial allergic rhinitis 07/19/2021   Gastroesophageal reflux disease 07/19/2021   Moderate persistent asthma, uncomplicated 07/19/2021   Globus sensation  07/19/2021   Acute cystitis 10/04/2020   Shortness of breath 06/15/2020   Other forms of systemic lupus erythematosus (HCC) 04/27/2020   History of colonic polyps    Avascular necrosis of bone (HCC) 11/17/2018   Pain in joint of right hip 09/02/2018   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  Tracie Barnes is a 70 y.o. female presenting for a follow up visit.  She was last seen as a new patient in May 2022.  At that time, her lung testing was in the 50% range but improved to 100% with the bronchodilator.  This certainly sound like asthma, so we recommended using her inhaler on a routine basis.  She was on Symbicort 160 mcg, which we continued.  She was not using it consistently before.  She had testing that was positive to pollens as well as dust mites.  We continue with Zyrtec and Flonase.  We added on Astelin 2 sprays per nostril up to twice daily.  We did discuss allergen immunotherapy for long-term control.  In the interim, she did decide to start allergy shots.  Asthma/Respiratory Symptom History: She does report that she has been more consistent with the Symbicort since last visit.  She has not been using her rescue inhaler much at all.  Her breathing is much improved, which she seems to associate more with allergy shots.  She has not been using her rescue inhaler. ACT  score is 25, indicating excellent asthma control.  Allergic Rhinitis Symptom History: She has been using her Zyrtec.  She has the no sprays that she uses as needed.  She has not needed antibiotics or prednisone since last visit.  Tracie Barnes is on allergen immunotherapy. She receives one injection. Immunotherapy script #1 contains trees, weeds, grasses, and dust mites. She currently receives 0.29mL of the BLUE vial (1/100,000). She started shots  May of 2022 and not yet reached maintenance.   She does report a bit of mucous in her throat that has cleared somewhat. She thinks it is definitely starting to get better. She has not tried using Mucinex. She is on Dexilant daily.   Otherwise, there have been no changes to her past medical history, surgical history, family history, or social history.    Review of Systems  Constitutional: Negative.  Negative for chills, fever, malaise/fatigue and weight loss.  HENT:  Negative for congestion, ear discharge, ear pain and sinus pain.   Eyes:  Negative for pain, discharge and redness.  Respiratory:  Positive for shortness of breath. Negative for cough, sputum production and wheezing.   Cardiovascular: Negative.  Negative for chest pain and palpitations.  Gastrointestinal:  Negative for abdominal pain, constipation, diarrhea, heartburn, nausea and vomiting.  Skin: Negative.  Negative for itching and rash.  Neurological:  Negative for dizziness and headaches.  Endo/Heme/Allergies:  Positive for environmental allergies. Does not bruise/bleed easily.      Objective:   Blood pressure 110/70, pulse 82, temperature (!) 97.3 F (36.3 C), temperature source Temporal, resp. rate 16, SpO2 98 %. There is no height or weight on file to calculate BMI.   Physical Exam:  Physical Exam Vitals reviewed.  Constitutional:      Appearance: She is well-developed.  HENT:     Head: Normocephalic and atraumatic.     Right Ear: Tympanic membrane, ear canal and external ear normal.     Left Ear: Tympanic membrane, ear canal and external ear normal.     Nose: No nasal deformity, septal deviation, mucosal edema or rhinorrhea.     Right Turbinates: Enlarged and swollen.     Left Turbinates: Enlarged and swollen.     Right Sinus: No maxillary sinus tenderness or frontal sinus tenderness.     Left Sinus: No maxillary sinus tenderness or frontal sinus tenderness.     Mouth/Throat:     Mouth: Mucous membranes  are not pale and not dry.     Pharynx: Uvula midline.     Comments: Oropharynx clear.  No postnasal drip. Eyes:     General: Lids are normal. No allergic shiner.       Right eye: No discharge.        Left eye: No discharge.     Conjunctiva/sclera: Conjunctivae normal.     Right eye: Right conjunctiva is not injected. No chemosis.    Left eye: Left conjunctiva is not injected. No chemosis.    Pupils: Pupils are equal, round, and reactive to light.  Cardiovascular:     Rate and Rhythm: Normal rate and regular rhythm.     Heart sounds: Normal heart sounds.  Pulmonary:     Effort: Pulmonary effort is normal. No tachypnea, accessory muscle usage or respiratory distress.     Breath sounds: Normal breath sounds. No wheezing, rhonchi or rales.     Comments: No crackles or wheezes noted. Chest:     Chest wall: No tenderness.  Lymphadenopathy:     Cervical: No cervical  adenopathy.  Skin:    Coloration: Skin is not pale.     Findings: No abrasion, erythema, petechiae or rash. Rash is not papular, urticarial or vesicular.  Neurological:     Mental Status: She is alert.  Psychiatric:        Behavior: Behavior is cooperative.     Diagnostic studies:    Spirometry: results abnormal (FEV1: 1.54/77%, FVC: 2.50/97%, FEV1/FVC: 62%).    Spirometry consistent with mild obstructive disease.    Allergy Studies: none        Malachi Bonds, MD  [facility]

## 2021-07-19 ENCOUNTER — Encounter: Payer: Self-pay | Admitting: Allergy & Immunology

## 2021-07-19 DIAGNOSIS — R198 Other specified symptoms and signs involving the digestive system and abdomen: Secondary | ICD-10-CM | POA: Insufficient documentation

## 2021-07-19 DIAGNOSIS — R0989 Other specified symptoms and signs involving the circulatory and respiratory systems: Secondary | ICD-10-CM | POA: Insufficient documentation

## 2021-07-19 DIAGNOSIS — K219 Gastro-esophageal reflux disease without esophagitis: Secondary | ICD-10-CM | POA: Insufficient documentation

## 2021-07-19 DIAGNOSIS — J302 Other seasonal allergic rhinitis: Secondary | ICD-10-CM | POA: Insufficient documentation

## 2021-07-19 DIAGNOSIS — J3089 Other allergic rhinitis: Secondary | ICD-10-CM | POA: Insufficient documentation

## 2021-07-19 DIAGNOSIS — J4540 Moderate persistent asthma, uncomplicated: Secondary | ICD-10-CM | POA: Insufficient documentation

## 2021-07-20 ENCOUNTER — Telehealth: Payer: Self-pay | Admitting: *Deleted

## 2021-07-20 MED ORDER — ADVAIR HFA 115-21 MCG/ACT IN AERO: 2.0000 | INHALATION_SPRAY | Freq: Two times a day (BID) | RESPIRATORY_TRACT | 12 refills | Status: DC

## 2021-07-20 NOTE — Telephone Encounter (Signed)
We can do Advair 115/21 two puffs twice daily.  Malachi Bonds, MD [facility]

## 2021-07-20 NOTE — Telephone Encounter (Signed)
Received a fax from pharmacy stating that the patient's insurance prefers Advair over Symbicort. From what I see in her previous meds she has only tried Clinical cytogeneticist. Do you want to send in Advair, or would you like for me to try a PA?

## 2021-07-20 NOTE — Telephone Encounter (Signed)
Sent in advair informed pt of the change and confirmed pharmacy

## 2021-07-20 NOTE — Addendum Note (Signed)
Addended by: Berna Bue on: 07/20/2021 03:02 PM   Modules accepted: Orders

## 2021-07-28 ENCOUNTER — Ambulatory Visit (INDEPENDENT_AMBULATORY_CARE_PROVIDER_SITE_OTHER): Payer: No Typology Code available for payment source

## 2021-07-28 DIAGNOSIS — J309 Allergic rhinitis, unspecified: Secondary | ICD-10-CM

## 2021-08-08 ENCOUNTER — Encounter: Payer: Self-pay | Admitting: Allergy & Immunology

## 2021-08-08 ENCOUNTER — Ambulatory Visit (INDEPENDENT_AMBULATORY_CARE_PROVIDER_SITE_OTHER): Payer: No Typology Code available for payment source | Admitting: *Deleted

## 2021-08-08 DIAGNOSIS — J309 Allergic rhinitis, unspecified: Secondary | ICD-10-CM

## 2021-08-25 ENCOUNTER — Ambulatory Visit (INDEPENDENT_AMBULATORY_CARE_PROVIDER_SITE_OTHER): Payer: No Typology Code available for payment source | Admitting: *Deleted

## 2021-08-25 DIAGNOSIS — J309 Allergic rhinitis, unspecified: Secondary | ICD-10-CM

## 2021-09-07 ENCOUNTER — Ambulatory Visit (INDEPENDENT_AMBULATORY_CARE_PROVIDER_SITE_OTHER): Payer: No Typology Code available for payment source

## 2021-09-07 DIAGNOSIS — J309 Allergic rhinitis, unspecified: Secondary | ICD-10-CM | POA: Diagnosis not present

## 2021-09-15 ENCOUNTER — Ambulatory Visit (INDEPENDENT_AMBULATORY_CARE_PROVIDER_SITE_OTHER): Payer: No Typology Code available for payment source

## 2021-09-15 DIAGNOSIS — J309 Allergic rhinitis, unspecified: Secondary | ICD-10-CM

## 2021-09-15 DIAGNOSIS — J339 Nasal polyp, unspecified: Secondary | ICD-10-CM | POA: Diagnosis not present

## 2021-10-03 ENCOUNTER — Ambulatory Visit (INDEPENDENT_AMBULATORY_CARE_PROVIDER_SITE_OTHER): Payer: No Typology Code available for payment source | Admitting: Allergy & Immunology

## 2021-10-03 ENCOUNTER — Other Ambulatory Visit: Payer: Self-pay

## 2021-10-03 ENCOUNTER — Encounter: Payer: Self-pay | Admitting: Allergy & Immunology

## 2021-10-03 ENCOUNTER — Ambulatory Visit: Payer: Self-pay | Admitting: *Deleted

## 2021-10-03 VITALS — BP 118/80 | HR 83 | Temp 98.3°F | Resp 18 | Ht 65.0 in | Wt 153.8 lb

## 2021-10-03 DIAGNOSIS — J4540 Moderate persistent asthma, uncomplicated: Secondary | ICD-10-CM | POA: Diagnosis not present

## 2021-10-03 DIAGNOSIS — R519 Headache, unspecified: Secondary | ICD-10-CM

## 2021-10-03 DIAGNOSIS — J309 Allergic rhinitis, unspecified: Secondary | ICD-10-CM

## 2021-10-03 DIAGNOSIS — K219 Gastro-esophageal reflux disease without esophagitis: Secondary | ICD-10-CM | POA: Diagnosis not present

## 2021-10-03 DIAGNOSIS — J302 Other seasonal allergic rhinitis: Secondary | ICD-10-CM

## 2021-10-03 MED ORDER — BUDESONIDE-FORMOTEROL FUMARATE 160-4.5 MCG/ACT IN AERO: 2.0000 | INHALATION_SPRAY | Freq: Two times a day (BID) | RESPIRATORY_TRACT | 5 refills | Status: DC

## 2021-10-03 MED ORDER — CETIRIZINE HCL 10 MG PO TABS: 10.0000 mg | ORAL_TABLET | Freq: Every day | ORAL | 1 refills | Status: DC

## 2021-10-03 MED ORDER — HYDROXYZINE PAMOATE 25 MG PO CAPS: ORAL_CAPSULE | ORAL | 5 refills | Status: AC

## 2021-10-03 MED ORDER — MONTELUKAST SODIUM 10 MG PO TABS: 10.0000 mg | ORAL_TABLET | Freq: Every day | ORAL | 5 refills | Status: AC

## 2021-10-03 MED ORDER — AZELASTINE HCL 0.1 % NA SOLN: NASAL | 5 refills | Status: DC

## 2021-10-03 MED ORDER — ALBUTEROL SULFATE HFA 108 (90 BASE) MCG/ACT IN AERS: 4.0000 | INHALATION_SPRAY | Freq: Four times a day (QID) | RESPIRATORY_TRACT | 1 refills | Status: DC | PRN

## 2021-10-03 NOTE — Progress Notes (Signed)
FOLLOW UP  Date of Service/Encounter:  10/03/21   Assessment:   Moderate persistent asthma, uncomplicated  Headaches - new onset (COVID negative)   Seasonal and perennial allergic rhinitis (grasses, ragweed, weeds, trees and dust mites) - on allergen immunotherapy   GERD - on PPI   Lupus  Plan/Recommendations:   1. Moderate persistent asthma, uncomplicated - Lung testing looked great today.  - Daily controller medication(s): Symbicort 160/4.59mcg two puffs twice daily with spacer - Prior to physical activity: albuterol 2 puffs 10-15 minutes before physical activity. - Rescue medications: albuterol 4 puffs every 4-6 hours as needed - Asthma control goals:  * Full participation in all desired activities (may need albuterol before activity) * Albuterol use two time or less a week on average (not counting use with activity) * Cough interfering with sleep two time or less a month * Oral steroids no more than once a year * No hospitalizations  2. Seasonal and perennial allergic rhinitis (grasses, ragweed, weeds, trees and dust mites) - Continue with allergy shots at the same schedule.  - Continue with: Zyrtec (cetirizine) 10mg  tablet once daily and Flonase (fluticasone) one spray per nostril daily - Continue taking: Astelin (azelastine) 2 sprays per nostril 1-2 times daily as needed - Samples of Mucinex provided to help clear up that mucous plug in your throat.   3. Headaches - Start the prednisone dose pack provided.  - Continue with Aleve three times daily. - Add on hydroxyzine 25mg  -50mg  at night (causes sleepiness).  - If this is not working by the end of the week, I would contact your PCP.  - COVID testing was negative.   4. Follow up as scheduled.   Subjective:   Tracie Barnes is a 70 y.o. female presenting today for follow up of  Chief Complaint  Patient presents with   Asthma    Some coughing and wheezing    Allergic Rhinitis     No flares    Tracie Barnes has a history of the following: Patient Active Problem List   Diagnosis Date Noted   Seasonal and perennial allergic rhinitis 07/19/2021   Gastroesophageal reflux disease 07/19/2021   Moderate persistent asthma, uncomplicated 07/19/2021   Globus sensation 07/19/2021   Acute cystitis 10/04/2020   Shortness of breath 06/15/2020   Other forms of systemic lupus erythematosus (HCC) 04/27/2020   History of colonic polyps    Avascular necrosis of bone (HCC) 11/17/2018   Pain in joint of right hip 09/02/2018   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  Tracie Barnes is a 70 y.o. female presenting for a sick visit.  She was last seen in July 2022.  At that time, lung testing looked great. We did not make any changes at this time. She was doing well on allergen immunotherapy.   In the interim, she has mostly done well. She is here today for evaluation of headaches.   She reports that she has had a headache for a couple of days. She has been mostly at home. She did not think that she out there too long. It hurts mostly in the front and it is throbbing and extending towards her occipital lobes. She typically has headaches and they typically resolve with NSAIDs. She has been using some Aleve and she also has  some aspirin that she takes. She does report some sinus headaches, but this is different and is affecting her vision (blurry vision - she reports a pull or something). She did not contact her PCP because she was coming here for a shot anyway. She has no history of migraines.  Asthma/Respiratory Symptom History: She remains on the albuterol as needed and the Symbicort two puffs twice daily. Jasmina's asthma has been well controlled. She has not required rescue medication, experienced nocturnal awakenings due to  lower respiratory symptoms, nor have activities of daily living been limited. She has required no Emergency Department or Urgent Care visits for her asthma. She has required zero courses of systemic steroids for asthma exacerbations since the last visit. ACT score today is 25, indicating excellent asthma symptom control.   Allergic Rhinitis Symptom History: She remains on the cetirizine as well as the fluticasone and the azelastine . She is feeling better, either because of the shots or the change of medications. She is not using an eye drop at this time. She has Mucinex to use as needed.   Kamika is on allergen immunotherapy. She receives one injection. Immunotherapy script #1 contains trees, weeds, grasses, and dust mites. She currently receives 0.9mL of the YELLOW vial (1/10,000). She started shots May of 2022 and not yet reached maintenance.   She has OSA and is on a CPAP for the past 2-3 years. She was stopping breathing around 70 times per night. She has had an increase in her energy levels and decrease in her fatigue since starting the CPAP.   Otherwise, there have been no changes to her past medical history, surgical history, family history, or social history.    Review of Systems  Constitutional: Negative.  Negative for chills, fever, malaise/fatigue and weight loss.  HENT:  Positive for congestion. Negative for ear discharge, ear pain and sinus pain.   Eyes:  Negative for pain, discharge and redness.  Respiratory:  Negative for cough, sputum production, shortness of breath and wheezing.   Cardiovascular: Negative.  Negative for chest pain and palpitations.  Gastrointestinal:  Negative for abdominal pain, constipation, diarrhea, heartburn, nausea and vomiting.  Skin: Negative.  Negative for itching and rash.  Neurological:  Positive for headaches. Negative for dizziness.  Endo/Heme/Allergies:  Positive for environmental allergies. Does not bruise/bleed easily.      Objective:    Blood pressure 118/80, pulse 83, temperature 98.3 F (36.8 C), resp. rate 18, height 5\' 5"  (1.651 m), weight 153 lb 12.8 oz (69.8 kg), SpO2 98 %. Body mass index is 25.59 kg/m.   Physical Exam:  Physical Exam Vitals reviewed.  Constitutional:      Appearance: She is well-developed.  HENT:     Head: Normocephalic and atraumatic.     Right Ear: Tympanic membrane, ear canal and external ear normal.     Left Ear: Tympanic membrane, ear canal and external ear normal.     Nose: No nasal deformity, septal deviation, mucosal edema or rhinorrhea.     Right Turbinates: Not enlarged or swollen.     Left Turbinates: Not enlarged or swollen.     Right Sinus: Frontal sinus tenderness present. No maxillary sinus tenderness.     Left Sinus: Frontal sinus tenderness present. No maxillary sinus tenderness.     Comments: There is tenderness bilaterally extending to the occipital regions.     Mouth/Throat:     Mouth: Mucous membranes are not pale and not dry.     Pharynx: Uvula midline.  Eyes:     General: Lids are normal. No allergic shiner.       Right eye: No discharge.        Left eye: No discharge.     Conjunctiva/sclera: Conjunctivae normal.     Right eye: Right conjunctiva is not injected. No chemosis.    Left eye: Left conjunctiva is not injected. No chemosis.    Pupils: Pupils are equal, round, and reactive to light.  Cardiovascular:     Rate and Rhythm: Normal rate and regular rhythm.     Heart sounds: Normal heart sounds.  Pulmonary:     Effort: Pulmonary effort is normal. No tachypnea, accessory muscle usage or respiratory distress.     Breath sounds: Normal breath sounds. No wheezing, rhonchi or rales.     Comments: Moving air well in all lung fields. Chest:     Chest wall: No tenderness.  Lymphadenopathy:     Cervical: No cervical adenopathy.  Skin:    General: Skin is warm.     Capillary Refill: Capillary refill takes less than 2 seconds.     Coloration: Skin is not  pale.     Findings: No abrasion, erythema, petechiae or rash. Rash is not papular, urticarial or vesicular.     Comments: No eczematous or urticarial lesions noted.  Neurological:     Mental Status: She is alert.  Psychiatric:        Behavior: Behavior is cooperative.     Diagnostic studies:   Spirometry: results normal (FEV1: 1.83/92%, FVC: 2.37/92%, FEV1/FVC: 77%).    Spirometry consistent with normal pattern.   COVID testing: negative        Malachi Bonds, MD  [facility]

## 2021-10-03 NOTE — Patient Instructions (Addendum)
1. Moderate persistent asthma, uncomplicated - Lung testing looked great today.  - Daily controller medication(s): Symbicort 160/4.44mcg two puffs twice daily with spacer - Prior to physical activity: albuterol 2 puffs 10-15 minutes before physical activity. - Rescue medications: albuterol 4 puffs every 4-6 hours as needed - Asthma control goals:  * Full participation in all desired activities (may need albuterol before activity) * Albuterol use two time or less a week on average (not counting use with activity) * Cough interfering with sleep two time or less a month * Oral steroids no more than once a year * No hospitalizations  2. Seasonal and perennial allergic rhinitis (grasses, ragweed, weeds, trees and dust mites) - Continue with allergy shots at the same schedule.  - Continue with: Zyrtec (cetirizine) 10mg  tablet once daily and Flonase (fluticasone) one spray per nostril daily - Continue taking: Astelin (azelastine) 2 sprays per nostril 1-2 times daily as needed - Samples of Mucinex provided to help clear up that mucous plug in your throat.   3. Headaches - Start the prednisone dose pack provided.  - Continue with Aleve three times daily. - Add on hydroxyzine 25mg  -50mg  at night (causes sleepiness)/. - If this is not working by the end of the week, I would contact your PCP.   4. Follow up as scheduled.   Please inform of any Emergency Department visits, hospitalizations, or changes in symptoms. Call before going to the ED for breathing or allergy symptoms since we might be able to fit you in for a sick visit. Feel free to contact anytime with any questions, problems, or concerns.  It was a pleasure to see you again today!  Websites that have reliable patient information: 1. American Academy of Asthma, Allergy, and Immunology: www.aaaai.org 2. Food Allergy Research and Education (FARE): foodallergy.org 3. Mothers of Asthmatics: http://www.asthmacommunitynetwork.org 4.  American College of Allergy, Asthma, and Immunology: www.acaai.org   COVID-19 Vaccine Information can be found at: [place] For questions related to vaccine distribution or appointments, please email [email or web address] or call [phone].   We realize that you might be concerned about having an allergic reaction to the COVID19 vaccines. To help with that concern, WE ARE OFFERING THE COVID19 VACCINES IN OUR OFFICE! Ask the front desk for dates!     "Like" [place] on Facebook and Instagram for our latest updates!      A healthy democracy works best when [email or web address] participate! Make sure you are registered to vote! If you have moved or changed any of your contact information, you will need to get this updated before voting!  In some cases, you MAY be able to register to vote online: [phone]

## 2021-10-04 ENCOUNTER — Telehealth: Payer: Self-pay

## 2021-10-04 ENCOUNTER — Other Ambulatory Visit: Payer: Self-pay | Admitting: *Deleted

## 2021-10-04 MED ORDER — FLUTICASONE-SALMETEROL 250-50 MCG/ACT IN AEPB: 1.0000 | INHALATION_SPRAY | Freq: Two times a day (BID) | RESPIRATORY_TRACT | 5 refills | Status: DC

## 2021-10-04 NOTE — Telephone Encounter (Signed)
New prescription sent in. Called and left a voicemail asking for the patient to return call to inform.

## 2021-10-04 NOTE — Telephone Encounter (Signed)
We can change to Wixela 250/50 one puff twice daily.  Malachi Bonds, MD [facility]

## 2021-10-04 NOTE — Telephone Encounter (Signed)
[facility] [place] [facility] do not [facility] the symbicort inhaler the pharmacist would like [place] to switch the inhaler to wixela. Please advise.

## 2021-10-05 NOTE — Telephone Encounter (Signed)
Called and left a voicemail asking for patient to return call to discuss.  °

## 2021-10-05 NOTE — Telephone Encounter (Signed)
Patient called back and was informed. [facility] stated that [facility], but she stated that as long as she has medication she is fine. She verbalized understanding.

## 2021-10-13 ENCOUNTER — Ambulatory Visit (INDEPENDENT_AMBULATORY_CARE_PROVIDER_SITE_OTHER): Payer: No Typology Code available for payment source | Admitting: *Deleted

## 2021-10-13 DIAGNOSIS — J309 Allergic rhinitis, unspecified: Secondary | ICD-10-CM

## 2021-10-24 ENCOUNTER — Ambulatory Visit (INDEPENDENT_AMBULATORY_CARE_PROVIDER_SITE_OTHER): Payer: No Typology Code available for payment source | Admitting: *Deleted

## 2021-10-24 DIAGNOSIS — J309 Allergic rhinitis, unspecified: Secondary | ICD-10-CM

## 2021-11-09 ENCOUNTER — Ambulatory Visit (INDEPENDENT_AMBULATORY_CARE_PROVIDER_SITE_OTHER): Payer: No Typology Code available for payment source | Admitting: *Deleted

## 2021-11-09 DIAGNOSIS — J309 Allergic rhinitis, unspecified: Secondary | ICD-10-CM | POA: Diagnosis not present

## 2021-11-17 ENCOUNTER — Other Ambulatory Visit: Payer: Self-pay | Admitting: Pulmonary Disease

## 2021-11-17 ENCOUNTER — Ambulatory Visit (INDEPENDENT_AMBULATORY_CARE_PROVIDER_SITE_OTHER): Payer: No Typology Code available for payment source

## 2021-11-17 DIAGNOSIS — J309 Allergic rhinitis, unspecified: Secondary | ICD-10-CM | POA: Diagnosis not present

## 2021-11-30 ENCOUNTER — Ambulatory Visit (INDEPENDENT_AMBULATORY_CARE_PROVIDER_SITE_OTHER): Payer: No Typology Code available for payment source | Admitting: *Deleted

## 2021-11-30 DIAGNOSIS — J309 Allergic rhinitis, unspecified: Secondary | ICD-10-CM | POA: Diagnosis not present

## 2021-12-07 ENCOUNTER — Encounter: Payer: Self-pay | Admitting: Allergy & Immunology

## 2021-12-07 ENCOUNTER — Ambulatory Visit (INDEPENDENT_AMBULATORY_CARE_PROVIDER_SITE_OTHER): Payer: No Typology Code available for payment source

## 2021-12-07 DIAGNOSIS — J309 Allergic rhinitis, unspecified: Secondary | ICD-10-CM

## 2021-12-14 ENCOUNTER — Ambulatory Visit (INDEPENDENT_AMBULATORY_CARE_PROVIDER_SITE_OTHER): Payer: No Typology Code available for payment source

## 2021-12-14 DIAGNOSIS — J309 Allergic rhinitis, unspecified: Secondary | ICD-10-CM | POA: Diagnosis not present

## 2022-01-08 ENCOUNTER — Ambulatory Visit (INDEPENDENT_AMBULATORY_CARE_PROVIDER_SITE_OTHER): Payer: No Typology Code available for payment source | Admitting: *Deleted

## 2022-01-08 DIAGNOSIS — J309 Allergic rhinitis, unspecified: Secondary | ICD-10-CM

## 2022-01-18 ENCOUNTER — Ambulatory Visit (INDEPENDENT_AMBULATORY_CARE_PROVIDER_SITE_OTHER): Payer: No Typology Code available for payment source | Admitting: Allergy & Immunology

## 2022-01-18 ENCOUNTER — Other Ambulatory Visit: Payer: Self-pay

## 2022-01-18 ENCOUNTER — Encounter: Payer: Self-pay | Admitting: Allergy & Immunology

## 2022-01-18 VITALS — BP 120/72 | HR 109 | Temp 98.2°F | Resp 16 | Ht 65.5 in | Wt 151.4 lb

## 2022-01-18 DIAGNOSIS — J309 Allergic rhinitis, unspecified: Secondary | ICD-10-CM | POA: Diagnosis not present

## 2022-01-18 DIAGNOSIS — J4540 Moderate persistent asthma, uncomplicated: Secondary | ICD-10-CM | POA: Diagnosis not present

## 2022-01-18 DIAGNOSIS — K219 Gastro-esophageal reflux disease without esophagitis: Secondary | ICD-10-CM | POA: Diagnosis not present

## 2022-01-18 DIAGNOSIS — R519 Headache, unspecified: Secondary | ICD-10-CM

## 2022-01-18 NOTE — Patient Instructions (Addendum)
1. Moderate persistent asthma, uncomplicated - Lung testing looked much worse today, likely because you have been off of your controller medication.  - We are going to send in Advair [facility] for you. - We are also going to send in a new nebulizer as well.  - Daily controller medication(s): Advair 115/11mcg two puffs twice daily with spacer (we will send in a new spacer) - Prior to physical activity: albuterol 2 puffs 10-15 minutes before physical activity. - Rescue medications: albuterol 4 puffs every 4-6 hours as needed - Asthma control goals:  * Full participation in all desired activities (may need albuterol before activity) * Albuterol use two time or less a week on average (not counting use with activity) * Cough interfering with sleep two time or less a month * Oral steroids no more than once a year * No hospitalizations  2. Seasonal and perennial allergic rhinitis (grasses, ragweed, weeds, trees and dust mites) - Continue with allergy shots at the same schedule.  - Continue with: Zyrtec (cetirizine) 10mg  tablet once daily and Flonase (fluticasone) one spray per nostril daily - Continue taking: Astelin (azelastine) 2 sprays per nostril 1-2 times daily as needed - Samples of Mucinex provided to help clear up that mucous plug in your throat.   3. Headaches - We will refer you to see Dr. at [facility] for devaluation of your headaches in case you need to start something for your migraines.   4. Follow up in 4 months or earlier if needed.    Please inform [facility] of any Emergency Department visits, hospitalizations, or changes in symptoms. Call us before going to the ED for breathing or allergy symptoms since we might be able to fit you in for a sick visit. Feel free to contact us anytime with any questions, problems, or concerns.  It was a pleasure to see you again today!  Websites that have reliable patient information: 1. American Academy of Asthma, Allergy, and Immunology:  www.aaaai.org 2. Food Allergy Research and Education (FARE): foodallergy.org 3. Mothers of Asthmatics: http://www.asthmacommunitynetwork.org 4. American College of Allergy, Asthma, and Immunology: www.acaai.org   COVID-19 Vaccine Information can be found at: [place] For questions related to vaccine distribution or appointments, please email [email or web address] or call [phone].   We realize that you might be concerned about having an allergic reaction to the COVID19 vaccines. To help with that concern, WE ARE OFFERING THE COVID19 VACCINES IN OUR OFFICE! Ask the front desk for dates!     Like [phone] on [place] and Instagram for our latest updates!      A healthy democracy works best when [employer] participate! Make sure you are registered to vote! If you have moved or changed any of your contact information, you will need to get this updated before voting!  In some cases, you MAY be able to register to vote online: [employer]

## 2022-01-18 NOTE — Progress Notes (Signed)
FOLLOW UP  Date of Service/Encounter:  01/18/22   Assessment:   Moderate persistent asthma, uncomplicated   Headaches - new onset (COVID negative)   Seasonal and perennial allergic rhinitis (grasses, ragweed, weeds, trees and dust mites) - on allergen immunotherapy   GERD - on PPI   Lupus  Plan/Recommendations:   1. Moderate persistent asthma, uncomplicated - Lung testing looked much worse today, likely because you have been off of your controller medication.  - We are going to send in Advair [facility] for you. - We are also going to send in a new nebulizer as well.  - Daily controller medication(s): Advair 115/18mcg two puffs twice daily with spacer (we will send in a new spacer) - Prior to physical activity: albuterol 2 puffs 10-15 minutes before physical activity. - Rescue medications: albuterol 4 puffs every 4-6 hours as needed - Asthma control goals:  * Full participation in all desired activities (may need albuterol before activity) * Albuterol use two time or less a week on average (not counting use with activity) * Cough interfering with sleep two time or less a month * Oral steroids no more than once a year * No hospitalizations  2. Seasonal and perennial allergic rhinitis (grasses, ragweed, weeds, trees and dust mites) - Continue with allergy shots at the same schedule.  - Continue with: Zyrtec (cetirizine) 10mg  tablet once daily and Flonase (fluticasone) one spray per nostril daily - Continue taking: Astelin (azelastine) 2 sprays per nostril 1-2 times daily as needed - Samples of Mucinex provided to help clear up that mucous plug in your throat.   3. Headaches - We will refer you to see Dr. at [facility] for devaluation of your headaches in case you need to start something for your migraines.   4. Follow up in 4 months or earlier if needed.     Subjective:   Tracie Barnes is a 71 y.o. female presenting today for follow up of  Chief Complaint  Patient  presents with   Asthma    No flares  advair 115-21 is $308 at the regular pharmacy so she wants it sent to the [facility]. Wixela causes hoariness, but it beaks up the congestion in her chest. The astelin works really good     Eczema    Has an appointment with a dermatologist next week has some new spots.     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  Tracie Barnes is a 71 y.o. female presenting for a follow up visit.  He was last seen in October 2022.  At that time, He is single and great.  We gave him a Symbicort 160 mcg 2 puffs twice daily.  For her allergic rhinitis, we continue with allergy shots at the same schedule.  We also continue with Zyrtec and Flonase as well as Astelin.  For her headaches, we added on hydroxyzine 25 to 50 mg at night.  We also started her on prednisone.  Since the last visit, she has not done well.   Asthma/Respiratory Symptom History: Advair was around $300. Wixela worked well but she had hoarseness. She reports that she has had some upper airway congestion. She ended up going to the [facility] two weeks ago and was treated for a sinus infection. She received an antibiotic. She reports that she has some issues in the moisturize. She rarely goes out  in the evenings. She is more careful in the cold weather because her symptoms get worse overall.   She gets medications from the [facility] mostly. She is not often able to get Advair because they do not always stock it. She has been out of this for quite a few weeks. She does have home oxygen prescribed by the Neurologist (Dr. Sandria ManlyLove initially, but then [facility] I am not entirely sure of the indication for oxygen.   She has been seen by Dr. Melrose Nakayamaarmen Gonazalez and requested a Breztri refill. She is out of that refill and did not feel that it worked any better than the Advair HFA. She has not seen Dr. Porfirio Mylararmen for an OV since July 2021.   Allergic Rhinitis Symptom History: She continues to have some headache issues.  She seems to correlate her headaches with worsening sinus issues. She has never seen a Neurologist for headaches. She has seen a Neurologist for muscle spasms. She has not seen that person in quite some time. Review of her chart shows that she saw [facility]. [facility]ever[facility] back in February 2015. She felt at the time that she was not making any progress with her and she stopped seeing Dr. Vivia EwingYijun. She also has a neurologist now at the [facility]; she has not seen him in a while. He is the one prescribing the oxygen.   [facility]. She does have a diagnosis of lupus. She evidently had a biopsy done due to facial edema. She has a rheumatologist at the [facility] who does the treatment of her pain. She sees her every 3 months.   GERD Symptom History: She remains on her GERD medication.  She reports that she has some mucous production that tends to hang out in her chest, midline. She is on reflux medications and is good about taking these.    Otherwise, there have been no changes to her past medical history, surgical history, family history, or social history.    Review of Systems  Constitutional: Negative.  Negative for chills, fever, malaise/fatigue and weight loss.   HENT:  Positive for congestion. Negative for ear discharge, ear pain and sinus pain.   Eyes:  Negative for pain, discharge and redness.  Respiratory:  Negative for cough, sputum production, shortness of breath and wheezing.   Cardiovascular: Negative.  Negative for chest pain and palpitations.  Gastrointestinal:  Negative for abdominal pain, constipation, diarrhea, heartburn, nausea and vomiting.  Skin: Negative.  Negative for itching and rash.  Neurological:  Positive for headaches. Negative for dizziness.  Endo/Heme/Allergies:  Positive for environmental allergies. Does not bruise/bleed easily.      Objective:   Blood pressure 120/72, pulse (!) 109, temperature 98.2 F (36.8 C), resp. rate 16, height 5' 5.5" (1.664 m), weight 151 lb 6.4 oz (68.7 kg), SpO2  100 %. Body mass index is 24.81 kg/m.   Physical Exam:  Physical Exam Vitals reviewed.  Constitutional:      Appearance: She is well-developed.  HENT:     Head: Normocephalic and atraumatic.     Right Ear: Tympanic membrane, ear canal and external ear normal.     Left Ear: Tympanic membrane, ear canal and external ear normal.     Nose: No nasal deformity, septal deviation, mucosal edema or rhinorrhea.     Right Turbinates: Not enlarged, swollen or pale.     Left Turbinates: Not enlarged, swollen or pale.     Right Sinus: Frontal sinus tenderness present. No maxillary sinus tenderness.     Left Sinus: Frontal sinus tenderness present. No maxillary sinus tenderness.     Comments: There is tenderness bilaterally extending to the occipital regions.     Mouth/Throat:     Mouth: Mucous membranes are not pale and not dry.     Pharynx: Uvula midline.  Eyes:     General: Lids are normal. Allergic shiner present.        Right eye: No discharge.        Left eye: No discharge.     Conjunctiva/sclera: Conjunctivae normal.     Right eye: Right conjunctiva is not injected. No chemosis.    Left eye: Left conjunctiva is not injected.  No chemosis.    Pupils: Pupils are equal, round, and reactive to light.  Cardiovascular:     Rate and Rhythm: Normal rate and regular rhythm.     Heart sounds: Normal heart sounds.  Pulmonary:     Effort: Pulmonary effort is normal. No tachypnea, accessory muscle usage or respiratory distress.     Breath sounds: Normal breath sounds. No wheezing, rhonchi or rales.     Comments: Moving air well in all lung fields. Chest:     Chest wall: No tenderness.  Lymphadenopathy:     Cervical: No cervical adenopathy.  Skin:    General: Skin is warm.     Capillary Refill: Capillary refill takes less than 2 seconds.     Coloration: Skin is not pale.     Findings: No abrasion, erythema, petechiae or rash. Rash is not papular, urticarial or vesicular.     Comments: No eczematous or urticarial lesions noted.  Neurological:     Mental Status: She is alert.  Psychiatric:        Behavior: Behavior is cooperative.     Diagnostic studies:    Spirometry: results abnormal (FEV1: 1.14/59%, FVC: 2.38/96%, FEV1/FVC: 48%).    Spirometry consistent with moderate obstructive disease. I did not do a bronchodilator challenge since she was feeling fine and attributed this spirometric finding to her not being on controller medications.    Allergy Studies: none        Malachi Bonds, MD  [facility]

## 2022-01-23 ENCOUNTER — Telehealth: Payer: Self-pay

## 2022-01-23 NOTE — Telephone Encounter (Signed)
Spoke to patient & patient states she would like to use her Medicare & Tricare for life for referral.   Referral has been placed for Dr. Levert Feinstein - [facility].   Patient has been made aware of referral and will give them a call if she does not hear from Dr. Deberah Castle office.

## 2022-01-23 NOTE — Telephone Encounter (Signed)
Albin Felling can you check and see if the patient is wanting to go through the [place] or use her regular insurance for the following referral?  Thanks

## 2022-01-23 NOTE — Telephone Encounter (Signed)
-----  Message from Valentina Shaggy, MD sent at 01/18/2022 11:32 PM EST ----- Hi there! Can we refer to Neurology or does the [name] need to do that? She would like to see Dr. Marcial Pacas at [facility] since she has met her before and she sees her husband.

## 2022-02-02 ENCOUNTER — Ambulatory Visit (INDEPENDENT_AMBULATORY_CARE_PROVIDER_SITE_OTHER): Payer: No Typology Code available for payment source | Admitting: *Deleted

## 2022-02-02 DIAGNOSIS — J309 Allergic rhinitis, unspecified: Secondary | ICD-10-CM | POA: Diagnosis not present

## 2022-02-12 ENCOUNTER — Ambulatory Visit (INDEPENDENT_AMBULATORY_CARE_PROVIDER_SITE_OTHER): Payer: No Typology Code available for payment source

## 2022-02-12 DIAGNOSIS — J309 Allergic rhinitis, unspecified: Secondary | ICD-10-CM

## 2022-02-26 ENCOUNTER — Ambulatory Visit (INDEPENDENT_AMBULATORY_CARE_PROVIDER_SITE_OTHER): Payer: No Typology Code available for payment source

## 2022-02-26 ENCOUNTER — Telehealth: Payer: Self-pay | Admitting: Allergy & Immunology

## 2022-02-26 DIAGNOSIS — J309 Allergic rhinitis, unspecified: Secondary | ICD-10-CM | POA: Diagnosis not present

## 2022-02-26 MED ORDER — ALBUTEROL SULFATE HFA 108 (90 BASE) MCG/ACT IN AERS: 2.0000 | INHALATION_SPRAY | RESPIRATORY_TRACT | 5 refills | Status: AC | PRN

## 2022-02-26 MED ORDER — ADVAIR HFA 115-21 MCG/ACT IN AERO: 2.0000 | INHALATION_SPRAY | Freq: Two times a day (BID) | RESPIRATORY_TRACT | 12 refills | Status: AC

## 2022-02-26 MED ORDER — AZELASTINE HCL 0.1 % NA SOLN: 2.0000 | Freq: Two times a day (BID) | NASAL | 5 refills | Status: DC | PRN

## 2022-02-26 MED ORDER — CETIRIZINE HCL 10 MG PO TABS: 10.0000 mg | ORAL_TABLET | Freq: Every day | ORAL | 5 refills | Status: DC

## 2022-02-26 NOTE — Telephone Encounter (Signed)
Sent in refills of adair, albuterol hfa, zyrtec and astelin nasal spray to [facility] informed pt of this

## 2022-02-26 NOTE — Telephone Encounter (Signed)
Patient came into the office and states she still hasn't gotten her refills.  Per Dr. Gillermina Hu last note, she would like Advair, Albuterol inhaler, cetirizine, astelin, and a nebulizer sent to [facility].  The pharmacy told her that their name under escribe is [facility]acy  or it could be [facility]  or their fax number is [phone].  Please advise

## 2022-03-09 ENCOUNTER — Ambulatory Visit (INDEPENDENT_AMBULATORY_CARE_PROVIDER_SITE_OTHER): Payer: No Typology Code available for payment source

## 2022-03-09 DIAGNOSIS — J309 Allergic rhinitis, unspecified: Secondary | ICD-10-CM

## 2022-03-19 ENCOUNTER — Ambulatory Visit (INDEPENDENT_AMBULATORY_CARE_PROVIDER_SITE_OTHER): Payer: No Typology Code available for payment source

## 2022-03-19 DIAGNOSIS — J309 Allergic rhinitis, unspecified: Secondary | ICD-10-CM | POA: Diagnosis not present

## 2022-03-27 DIAGNOSIS — J3089 Other allergic rhinitis: Secondary | ICD-10-CM | POA: Diagnosis not present

## 2022-03-27 NOTE — Progress Notes (Signed)
VIALS EXP 03-28-23 ?

## 2022-03-28 ENCOUNTER — Encounter: Payer: Self-pay | Admitting: Neurology

## 2022-03-28 ENCOUNTER — Ambulatory Visit (INDEPENDENT_AMBULATORY_CARE_PROVIDER_SITE_OTHER): Payer: Medicare Other | Admitting: Neurology

## 2022-03-28 ENCOUNTER — Ambulatory Visit (INDEPENDENT_AMBULATORY_CARE_PROVIDER_SITE_OTHER): Payer: No Typology Code available for payment source

## 2022-03-28 VITALS — BP 112/71 | HR 105 | Ht 65.5 in | Wt 151.0 lb

## 2022-03-28 DIAGNOSIS — R519 Headache, unspecified: Secondary | ICD-10-CM

## 2022-03-28 DIAGNOSIS — J309 Allergic rhinitis, unspecified: Secondary | ICD-10-CM

## 2022-03-28 DIAGNOSIS — G43709 Chronic migraine without aura, not intractable, without status migrainosus: Secondary | ICD-10-CM

## 2022-03-28 MED ORDER — BUTALBITAL-APAP-CAFFEINE 50-325-40 MG PO TABS: 1.0000 | ORAL_TABLET | Freq: Four times a day (QID) | ORAL | 5 refills | Status: DC | PRN

## 2022-03-28 NOTE — Progress Notes (Signed)
? ?Chief Complaint  ?Patient presents with  ? New Patient (Initial Visit)  ?  Rm 15. Alone. ?NX Dr. Orson Aloe 2017/internal referral for Migraines. ?Pt reports she has had a continuously headache for the past week. She reports monthly migraines that last for approx. one week, lessening in severity. Triggered by light. C/o muscle tension.  ? ? ? ? ?ASSESSMENT AND PLAN ? ?Tracie Barnes is a 71 y.o. female   ?Long history of chronic migraine headaches ?Mild increase the headache correlated with her allergy season, ? She is already on nortriptyline 25 mg 1 in the morning/2 at night ? Also on metoprolol ? Will try Fioricet as needed for migraine ? ?DIAGNOSTIC DATA (LABS, IMAGING, TESTING) ?- I reviewed patient records, labs, notes, testing and imaging myself where available. ? ? ?MEDICAL HISTORY: ? ?Tracie Barnes is a 57 female, accompanied by her husband, seen in request by Dr. Ernst Bowler, Gwen[facility] Daily, for evaluation of migraine headache, initial evaluation was on March 28, 2022 ? ? ?I reviewed and summarized the referring note. PMHX ?Allergy ?SLE, 2003, presented with muscle weakness, on Cellcept 500mg  bid, ?GERD ?HTN ? ?I saw her previously for migraine headache, last evaluation was in May 2017, ? ?She had long history of chronic migraine headaches, sometimes proceeding with visual aura, then followed by holoacranial pressure headache with light noise sensitivity, lasting for few hours, ? ?She is also outpatient follow-up [facility], reported most recent normal MRI of the brain at the [place], and also [facility] laboratory evaluations ? ?She complains of increased migraine headaches since 2013, couple times each months, worsened during this allergy season ? ?Previously she was having migraine about once a month, partial response to ibuprofen ? ?Previous MRI of the brain in 2017 showed no significant abnormalities ? ?PHYSICAL EXAM: ?  ?Vitals:  ? 03/28/22 1128  ?BP: 112/71  ?Pulse: (!) 105  ?Weight: 151 lb (68.5 kg)   ?Height: 5' 5.5" (1.664 m)  ? ?Not recorded ?  ? ? ?Body mass index is 24.75 kg/m?. ? ?PHYSICAL EXAMNIATION: ? ?Gen: NAD, conversant, well nourised, well groomed                     ?Cardiovascular: Regular rate rhythm, no peripheral edema, warm, nontender. ?Eyes: Conjunctivae clear without exudates or hemorrhage ?Neck: Supple, no carotid bruits. ?Pulmonary: Clear to auscultation bilaterally  ? ?NEUROLOGICAL EXAM: ? ?MENTAL STATUS: ?Speech/cognition: ?Awake, alert, oriented to history taking and casual conversation ?  ?CRANIAL NERVES: ?CN II: Visual fields are full to confrontation. Pupils are round equal and briskly reactive to light. ?CN III, IV, VI: extraocular movement are normal. No ptosis. ?CN V: Facial sensation is intact to light touch ?CN VII: Face is symmetric with normal eye closure  ?CN VIII: Hearing is normal to causal conversation. ?CN IX, X: Phonation is normal. ?CN XI: Head turning and shoulder shrug are intact ? ?MOTOR: ?There is no pronator drift of out-stretched arms. Muscle bulk and tone are normal. Muscle strength is normal. ? ?REFLEXES: ?Reflexes are 2+ and symmetric at the biceps, triceps, knees, and ankles. Plantar responses are flexor. ? ?SENSORY: ?Intact to light touch, pinprick and vibratory sensation are intact in fingers and toes. ? ?COORDINATION: ?There is no trunk or limb dysmetria noted. ? ?GAIT/STANCE: Need to push-up to get up from seated position, ? ?REVIEW OF SYSTEMS:  ?Full 14 system review of systems performed and notable only for as above ?All other review of systems were negative. ? ? ?ALLERGIES: ?Allergies  ?  Allergen Reactions  ? Ampicillin Other (See Comments)  ?  Pt had blisters all over her body ?Has patient had a PCN reaction causing immediate rash, facial/tongue/throat swelling, SOB or lightheadedness with hypotension: no ?Has patient had a PCN reaction causing severe rash involving mucus membranes or skin necrosis: yes ?Has patient had a PCN reaction that required  hospitalization: no ?Has patient had a PCN reaction occurring within the last 10 years: no ?If all of the above answers are "NO", then may proceed with Cephalosporin use. ?Pt had blisters all over her body ?Has patient had a PCN reaction causing immediate rash, facial/tongue/throat swelling, SOB or lightheadedness with hypotension: no ?Has patient had a PCN reaction causing severe rash involving mucus membranes or skin necrosis: yes ?Has patient had a PCN reaction that required hospitalization: no ?Has patient had a PCN reaction occurring within the last 10 years: no ?If all of the above answers are "NO", then may proceed with Cephalosporin use.  ? Methocarbamol Other (See Comments)  ? Prednisone Other (See Comments)  ? Sulfa Antibiotics Other (See Comments) and Hives  ? Amoxicillin Rash  ? Minocycline Rash  ? ? ?HOME MEDICATIONS: ?Current Outpatient Medications  ?Medication Sig Dispense Refill  ? ACIDOPHILUS LACTOBACILLUS PO Take 2 tablets by mouth daily.    ? albuterol (PROAIR HFA) 108 (90 Base) MCG/ACT inhaler Inhale 4 puffs into the lungs every 6 (six) hours as needed. 18 g 1  ? albuterol (VENTOLIN HFA) 108 (90 Base) MCG/ACT inhaler Inhale 2 puffs into the lungs every 4 (four) hours as needed for wheezing or shortness of breath. 1 each 5  ? azelastine (ASTELIN) 0.1 % nasal spray Place 1 spray into both nostrils 2 (two) times daily. Use in each nostril as directed 30 mL 5  ? azelastine (ASTELIN) 0.1 % nasal spray Use in each nostril as directed 30 mL 5  ? azelastine (ASTELIN) 0.1 % nasal spray Place 2 sprays into both nostrils 2 (two) times daily as needed for rhinitis. 30 mL 5  ? budesonide-formoterol (SYMBICORT) 160-4.5 MCG/ACT inhaler Inhale 2 puffs into the lungs 2 (two) times daily. 1 each 5  ? budesonide-formoterol (SYMBICORT) 160-4.5 MCG/ACT inhaler Inhale 2 puffs into the lungs in the morning and at bedtime. 10.2 g 5  ? cetirizine (ZYRTEC) 10 MG tablet Take 1 tablet (10 mg total) by mouth daily. 30 tablet  5  ? Cholecalciferol 10000 units TABS Take 1,000 Units by mouth daily.    ? clobetasol (TEMOVATE) 0.05 % external solution APPLY SMALL AMOUNT TO AFFECTED AREA DAILY AS NEEDED - RINSE IN SCALP. NEVER TO FACE.    ? dicyclomine (BENTYL) 10 MG capsule Take 10 mg by mouth 4 (four) times daily -  before meals and at bedtime.    ? docusate sodium (COLACE) 100 MG capsule Take 100 mg by mouth 2 (two) times daily.    ? fluocinonide (LIDEX) 0.05 % external solution APPLY SMALL AMOUNT TO AFFECTED AREA DAILY AS NEEDED    ? fluticasone (FLONASE) 50 MCG/ACT nasal spray Place 1 spray into both nostrils daily. 16 g 5  ? fluticasone-salmeterol (ADVAIR HFA) 115-21 MCG/ACT inhaler Inhale 2 puffs into the lungs 2 (two) times daily. 1 each 12  ? fluticasone-salmeterol (WIXELA INHUB) 250-50 MCG/ACT AEPB Inhale 1 puff into the lungs in the morning and at bedtime. 60 each 5  ? hydroxychloroquine (PLAQUENIL) 200 MG tablet Take by mouth daily.    ? hydrOXYzine (VISTARIL) 25 MG capsule Take 1-2 tablets nightly 60 capsule 5  ?  IPRATROPIUM BROMIDE NA Place 1 spray into the nose in the morning and at bedtime.    ? ketotifen (ZADITOR) 0.025 % ophthalmic solution INSTILL 1 DROP IN BOTH EYES TWICE A DAY AS NEEDED    ? metoprolol tartrate (LOPRESSOR) 25 MG tablet Take 0.5 tablets (12.5 mg total) by mouth daily. 45 tablet 1  ? montelukast (SINGULAIR) 10 MG tablet Take 1 tablet (10 mg total) by mouth at bedtime. 30 tablet 5  ? mycophenolate (CELLCEPT) 500 MG tablet Take 500 mg by mouth 2 (two) times daily.    ? nortriptyline (PAMELOR) 25 MG capsule 1 tab in am, 2 tabs at HS 270 capsule 1  ? Omega-3 Fatty Acids (FISH OIL) 1000 MG CAPS Take 1 capsule by mouth daily.    ? pantoprazole (PROTONIX) 40 MG tablet Take 40 mg by mouth 2 (two) times daily.    ? triamterene-hydrochlorothiazide (MAXZIDE-25) 37.5-25 MG tablet Take 0.5 tablets by mouth daily. 45 tablet 1  ? vitamin E 400 UNIT capsule Take 800 Units by mouth daily.     ? dexlansoprazole (DEXILANT) 60  MG capsule Take 1 capsule (60 mg total) by mouth daily. 90 capsule 1  ? ?No current facility-administered medications for this visit.  ? ? ?PAST MEDICAL HISTORY: ?Past Medical History:  ?Diagnosis Date

## 2022-04-23 ENCOUNTER — Emergency Department
Admission: EM | Admit: 2022-04-23 | Discharge: 2022-04-23 | Disposition: A | Discharge status: Home / Self Care | Payer: No Typology Code available for payment source | Attending: Emergency Medicine | Admitting: Emergency Medicine

## 2022-04-23 ENCOUNTER — Emergency Department: Payer: No Typology Code available for payment source

## 2022-04-23 ENCOUNTER — Other Ambulatory Visit: Payer: Self-pay

## 2022-04-23 DIAGNOSIS — I10 Essential (primary) hypertension: Secondary | ICD-10-CM | POA: Diagnosis not present

## 2022-04-23 DIAGNOSIS — J4521 Mild intermittent asthma with (acute) exacerbation: Secondary | ICD-10-CM | POA: Insufficient documentation

## 2022-04-23 DIAGNOSIS — R059 Cough, unspecified: Secondary | ICD-10-CM | POA: Diagnosis present

## 2022-04-23 MED ORDER — METHYLPREDNISOLONE 4 MG PO TBPK: ORAL_TABLET | ORAL | 0 refills | Status: DC

## 2022-04-23 MED ORDER — IPRATROPIUM-ALBUTEROL 0.5-2.5 (3) MG/3ML IN SOLN
3.0000 mL | Freq: Once | RESPIRATORY_TRACT | Status: AC
  Administered 2022-04-23: 3 mL via RESPIRATORY_TRACT
  Filled 2022-04-23: qty 3

## 2022-04-23 MED ORDER — IPRATROPIUM-ALBUTEROL 0.5-2.5 (3) MG/3ML IN SOLN: 3.0000 mL | RESPIRATORY_TRACT | 3 refills | Status: AC | PRN

## 2022-04-23 NOTE — ED Triage Notes (Signed)
Pt to ED for possible asthma exascerbation. States has pollen allergies and was outside yesterday. Breathing appears unlabored with no retractions or nasal flaring noted. Pt states also has sinus issues and feels like congested in head and ears.  ? ?This has been ongoing for about 1 week and has been using allergy and asthma meds at home with no relief. States has had a dry cough also. ?

## 2022-04-23 NOTE — ED Provider Notes (Signed)
? ?[facility] ?Provider Note ? ? ? Event Date/Time  ? First MD Initiated Contact with Patient 04/23/22 1242   ?  (approximate) ? ? ?History  ? ?Asthma ? ? ?HPI ? ?Tracie Barnes is a 71 y.o. female with history of hypertension, lupus, asthma, hyperlipidemia and allergies presents emergency department with acute exacerbation of asthma.  Patient states she been using her inhalers more frequently since Thursday.  States cough is been constant and wet..  Still feeling short of breath and hears congestion in her chest.  No swelling of lower extremities.  No fever or chills. ? ?  ? ? ?Physical Exam  ? ?Triage Vital Signs: ?ED Triage Vitals  ?Enc Vitals Group  ?   BP 04/23/22 1059 104/86  ?   Pulse Rate 04/23/22 1059 83  ?   Resp 04/23/22 1059 18  ?   Temp 04/23/22 1059 98.8 ?F (37.1 ?C)  ?   Temp Source 04/23/22 1059 Oral  ?   SpO2 04/23/22 1059 100 %  ?   Weight 04/23/22 1056 151 lb (68.5 kg)  ?   Height 04/23/22 1056 5\' 6"  (1.676 m)  ?   Head Circumference --   ?   Peak Flow --   ?   Pain Score 04/23/22 1056 0  ?   Pain Loc --   ?   Pain Edu? --   ?   Excl. in GC? --   ? ? ?Most recent vital signs: ?Vitals:  ? 04/23/22 1059  ?BP: 104/86  ?Pulse: 83  ?Resp: 18  ?Temp: 98.8 ?F (37.1 ?C)  ?SpO2: 100%  ? ? ? ?General: Awake, no distress.   ?CV:  Good peripheral perfusion. regular rate and  rhythm ?Resp:  Normal effort. Lungs wheezing posteriorly in both lung fields ?Abd:  No distention.   ?Other:    ? ? ?ED Results / Procedures / Treatments  ? ?Labs ?(all labs ordered are listed, but only abnormal results are displayed) ?Labs Reviewed - No data to display ? ? ?EKG ? ? ? ? ?RADIOLOGY ?Chest x-ray ? ? ? ?PROCEDURES: ? ? ?Procedures ? ? ?MEDICATIONS ORDERED IN ED: ?Medications  ?ipratropium-albuterol (DUONEB) 0.5-2.5 (3) MG/3ML nebulizer solution 3 mL (3 mLs Nebulization Given 04/23/22 1257)  ? ? ? ?IMPRESSION / MDM / ASSESSMENT AND PLAN / ED COURSE  ?I reviewed the triage vital signs and the nursing  notes. ?             ?               ? ?Differential diagnosis includes, but is not limited to, acute asthma exasperation, CAP, respiratory distress,  covid.04/25/22 ? ?Chest x-ray, DuoNeb ordered ? ?Chest x-ray was independently reviewed by me.  Not seen acute abnormality.  Confirmed by radiology ? ?Patient had relief with DuoNeb.  We will give her a prescription for DuoNeb Nebules, nebulizer machine, and Medrol Dosepak.  She is to follow-up with her regular doctor if not improving in 3 days.  Return emergency department worsening.  Discharged in stable condition. ? ? ? ?  ? ? ?FINAL CLINICAL IMPRESSION(S) / ED DIAGNOSES  ? ?Final diagnoses:  ?Mild intermittent asthma with exacerbation  ? ? ? ?Rx / DC Orders  ? ?ED Discharge Orders   ? ?      Ordered  ?  methylPREDNISolone (MEDROL DOSEPAK) 4 MG TBPK tablet       ? 04/23/22 1335  ?  ipratropium-albuterol (DUONEB) 0.5-2.5 (  3) MG/3ML SOLN  Every 4 hours PRN       ? 04/23/22 1335  ?  For home use only DME Nebulizer machine       ? 04/23/22 1335  ? ?  ?  ? ?  ? ? ? ?Note:  This document was prepared using Dragon voice recognition software and may include unintentional dictation errors. ? ?  ?Faythe Ghee, PA-C ?04/23/22 1809 ? ?  ?Concha Se, MD ?04/26/22 1526 ? ?

## 2022-04-23 NOTE — ED Notes (Signed)
See triage note  presents with some sinus drainage and pressure with some wheezing   states she has used her PO meds and inhalers with min  ?

## 2022-04-27 ENCOUNTER — Telehealth: Payer: Self-pay | Admitting: Allergy & Immunology

## 2022-04-27 ENCOUNTER — Ambulatory Visit (INDEPENDENT_AMBULATORY_CARE_PROVIDER_SITE_OTHER): Payer: No Typology Code available for payment source | Admitting: *Deleted

## 2022-04-27 DIAGNOSIS — J309 Allergic rhinitis, unspecified: Secondary | ICD-10-CM

## 2022-04-27 MED ORDER — NEBULIZER SYSTEM ALL-IN-ONE MISC: 1.0000 | 0 refills | Status: AC | PRN

## 2022-04-27 MED ORDER — ALBUTEROL SULFATE (2.5 MG/3ML) 0.083% IN NEBU: 2.5000 mg | INHALATION_SOLUTION | RESPIRATORY_TRACT | 1 refills | Status: AC | PRN

## 2022-04-27 NOTE — Telephone Encounter (Signed)
Faxed renewal of authorization request to [facility], [phone] and emailed it to [email or web address]. 

## 2022-04-27 NOTE — Telephone Encounter (Signed)
That is fine with me.  Please send in the nebulizer machine and albuterol nebulizer solution. ?

## 2022-04-27 NOTE — Telephone Encounter (Signed)
Called an informed patient that the nebulizer and medications were approved. I have sen prescriptions to [facility] pharmacy per her request.  ?

## 2022-04-27 NOTE — Telephone Encounter (Signed)
Patient came into office today and stated that DR Dellis Anes told her he was going to prescribe her a nebulizer and send to [facility] for authorization. She stated that she would like a call back to discuss this. Her number is [phone]. ?

## 2022-05-14 NOTE — Telephone Encounter (Signed)
Re-faxed authorization renewal request to [facility], [phone] ?

## 2022-05-22 ENCOUNTER — Ambulatory Visit: Payer: No Typology Code available for payment source | Admitting: Allergy & Immunology

## 2022-05-22 NOTE — Telephone Encounter (Signed)
Patient states the [place] wasn't going to cover her follow up visit for today, so she did cx today's appointment.  She states she doesn't want to stop her injections and would like to move forward with filing her T[employer] and MCR.   She is going to keep trying to get a new authorization.

## 2022-07-16 ENCOUNTER — Ambulatory Visit (INDEPENDENT_AMBULATORY_CARE_PROVIDER_SITE_OTHER): Payer: Medicare Other

## 2022-07-16 DIAGNOSIS — J309 Allergic rhinitis, unspecified: Secondary | ICD-10-CM

## 2022-07-26 ENCOUNTER — Ambulatory Visit (INDEPENDENT_AMBULATORY_CARE_PROVIDER_SITE_OTHER): Payer: Medicare Other

## 2022-07-26 DIAGNOSIS — J309 Allergic rhinitis, unspecified: Secondary | ICD-10-CM

## 2022-07-31 IMAGING — CR DG NECK SOFT TISSUE
1 series · 2 of 2 positions shown · non-contrast
Comparison: None.

CLINICAL DATA: Difficulty swallowing.

EXAM:
NECK SOFT TISSUES - 1+ VIEW

[Series 1: dg neck soft tissue · 0.14mm/px · 2 of 2 slices shown]
[im 1/2]
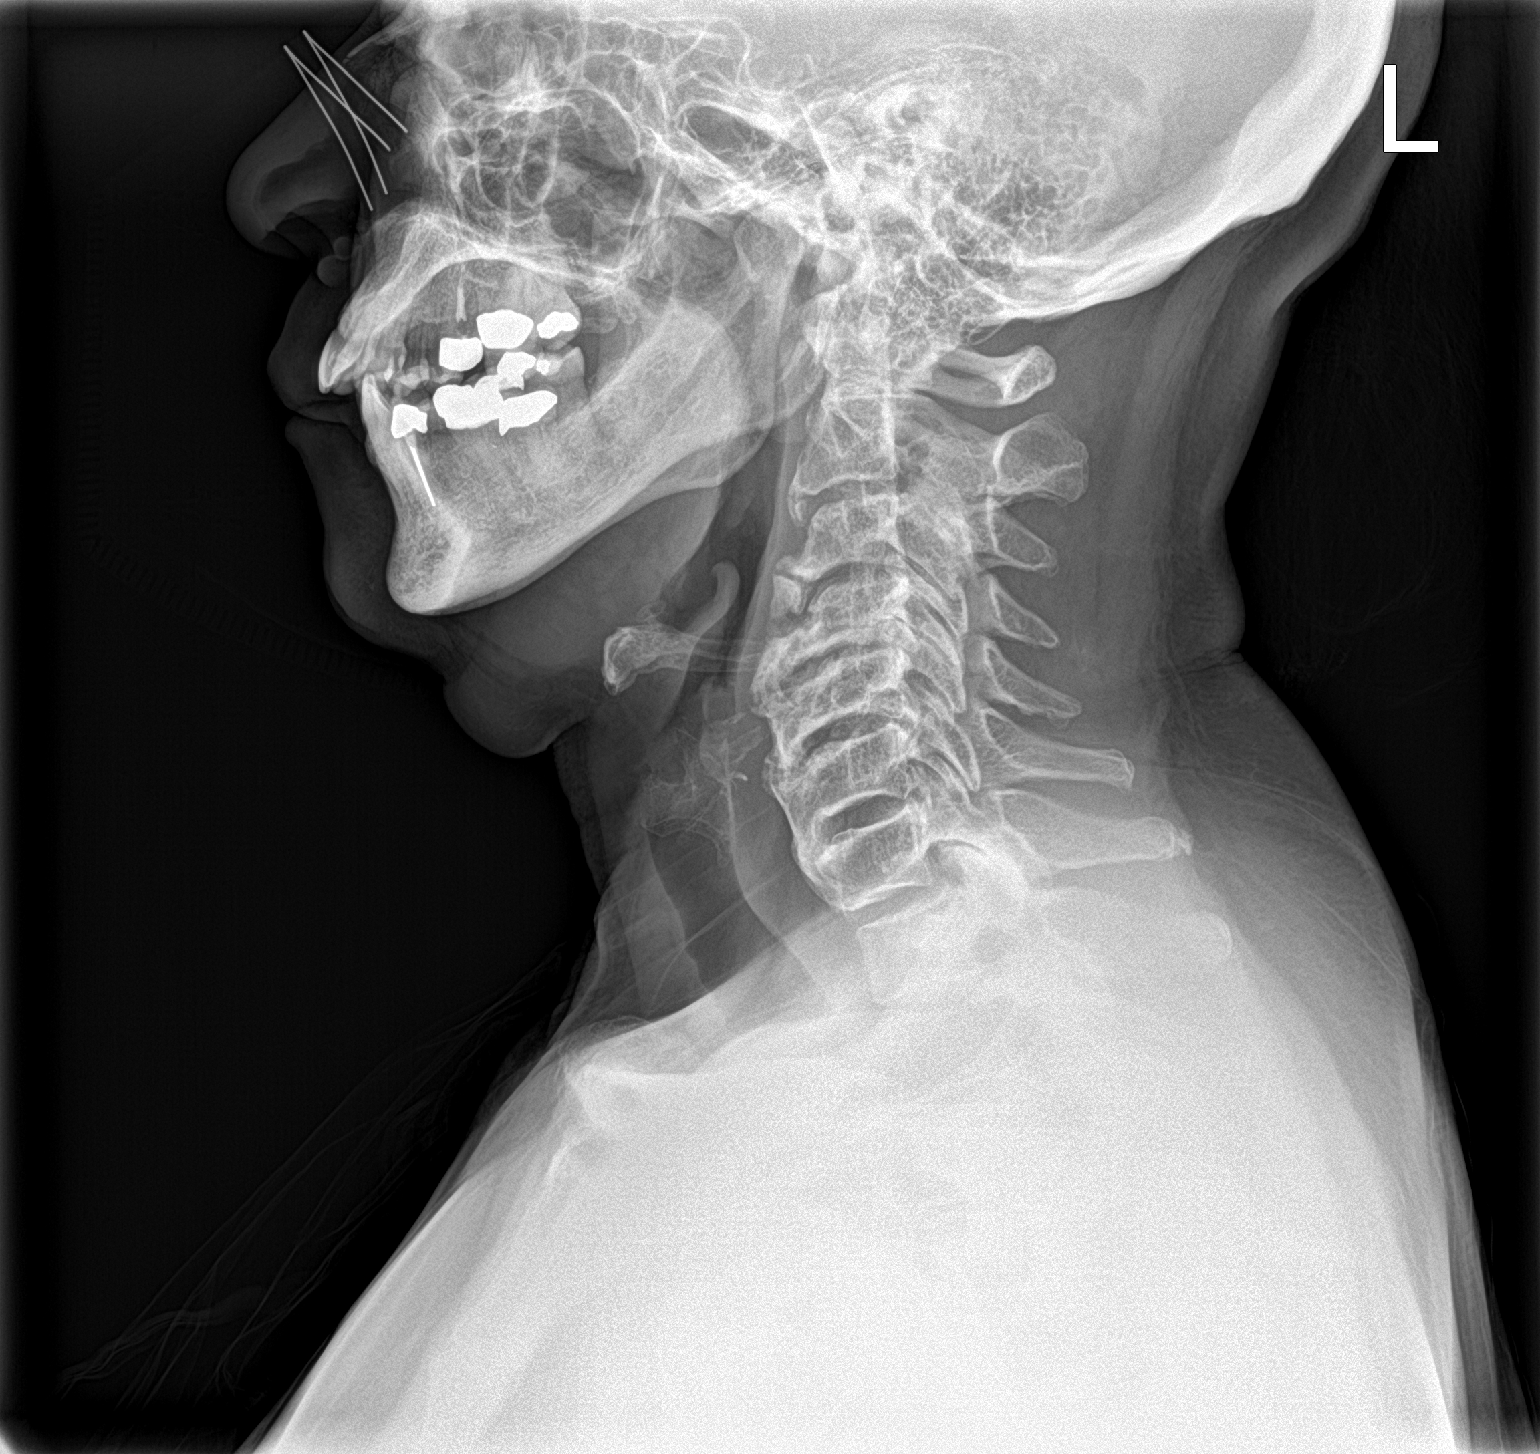
[im 2/2]
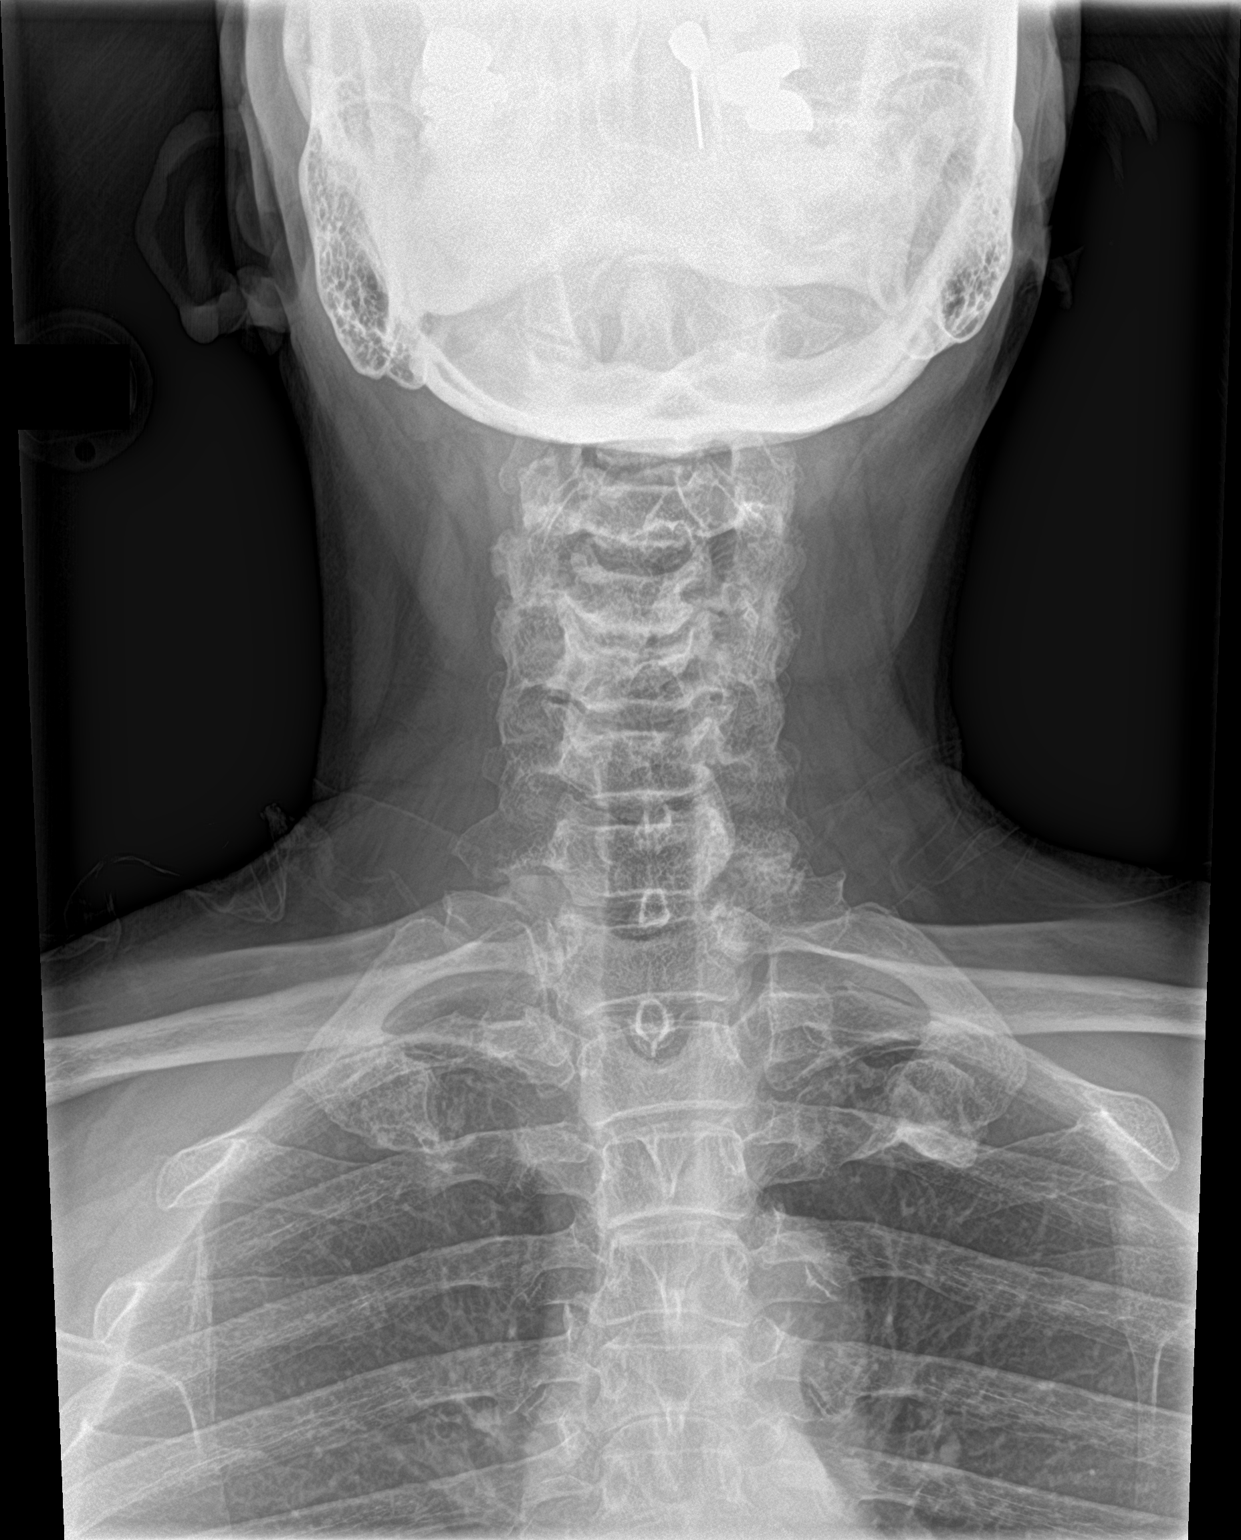

[2 of 2 positions shown; findings below may reference images not displayed]

FINDINGS: Two-view exam shows no evidence for fracture subluxation. There is
loss of disc height diffusely in the cervical spine with robust
endplate degeneration and anterior spurring. Anterior spurring at
the level of C4, C5, and C6 is on the order of 15 mm anterior to the
vertebral body. While there is no prevertebral soft tissue
thickening, course of the esophagus is likely anteriorly deviated
secondary to the spurring.
IMPRESSION: 1. No acute bony abnormality.
2. Advanced degenerative disease with robust anterior spurring in
the cervical spine

## 2022-08-07 ENCOUNTER — Ambulatory Visit (INDEPENDENT_AMBULATORY_CARE_PROVIDER_SITE_OTHER): Payer: Medicare Other | Admitting: *Deleted

## 2022-08-07 DIAGNOSIS — J309 Allergic rhinitis, unspecified: Secondary | ICD-10-CM

## 2022-08-16 ENCOUNTER — Ambulatory Visit (INDEPENDENT_AMBULATORY_CARE_PROVIDER_SITE_OTHER): Payer: Medicare Other | Admitting: *Deleted

## 2022-08-16 DIAGNOSIS — J309 Allergic rhinitis, unspecified: Secondary | ICD-10-CM

## 2022-08-28 ENCOUNTER — Ambulatory Visit (INDEPENDENT_AMBULATORY_CARE_PROVIDER_SITE_OTHER): Payer: Medicare Other | Admitting: *Deleted

## 2022-08-28 DIAGNOSIS — J309 Allergic rhinitis, unspecified: Secondary | ICD-10-CM

## 2022-09-04 ENCOUNTER — Ambulatory Visit (INDEPENDENT_AMBULATORY_CARE_PROVIDER_SITE_OTHER): Payer: No Typology Code available for payment source | Admitting: *Deleted

## 2022-09-04 DIAGNOSIS — J309 Allergic rhinitis, unspecified: Secondary | ICD-10-CM | POA: Diagnosis not present

## 2022-10-09 ENCOUNTER — Ambulatory Visit: Payer: Medicare Other | Admitting: Neurology

## 2022-10-09 ENCOUNTER — Encounter: Payer: Self-pay | Admitting: Neurology

## 2022-11-08 ENCOUNTER — Ambulatory Visit: Payer: Medicare Other | Admitting: Neurology

## 2022-11-13 ENCOUNTER — Other Ambulatory Visit: Payer: Self-pay | Admitting: Gastroenterology

## 2022-11-13 DIAGNOSIS — N882 Stricture and stenosis of cervix uteri: Secondary | ICD-10-CM

## 2022-11-13 DIAGNOSIS — R192 Visible peristalsis: Secondary | ICD-10-CM

## 2022-11-26 ENCOUNTER — Other Ambulatory Visit: Payer: Medicare Other

## 2022-12-04 ENCOUNTER — Ambulatory Visit: Payer: Medicare Other | Admitting: Neurology

## 2022-12-04 ENCOUNTER — Encounter: Payer: Self-pay | Admitting: Neurology

## 2023-02-18 ENCOUNTER — Ambulatory Visit: Payer: Medicare Other | Admitting: Neurology

## 2023-03-07 ENCOUNTER — Telehealth: Payer: Self-pay

## 2023-03-07 ENCOUNTER — Ambulatory Visit: Payer: Self-pay

## 2023-03-07 NOTE — Telephone Encounter (Signed)
Patient came in today and attempted to get her injection. Patient was informed that she was going to have to restart her allergy injections and was notified on the three restart policy. I scheduled her a new start appointment for three weeks out and scheduled her for a follow up with Dr. Ernst Bowler. Patient expressed that the move threw her off schedule as well as health issues with herself and family.

## 2023-03-08 NOTE — Telephone Encounter (Signed)
Thanks, [place]!   Salvatore Marvel, MD Allergy and [facility] [place]

## 2023-03-12 DIAGNOSIS — J3089 Other allergic rhinitis: Secondary | ICD-10-CM | POA: Diagnosis not present

## 2023-03-14 ENCOUNTER — Encounter: Payer: Self-pay | Admitting: Allergy & Immunology

## 2023-03-14 ENCOUNTER — Other Ambulatory Visit: Payer: Self-pay

## 2023-03-14 ENCOUNTER — Ambulatory Visit: Payer: No Typology Code available for payment source | Admitting: Allergy & Immunology

## 2023-03-14 VITALS — BP 122/72 | HR 103 | Temp 98.7°F | Resp 20 | Ht 64.75 in | Wt 148.3 lb

## 2023-03-14 DIAGNOSIS — J4540 Moderate persistent asthma, uncomplicated: Secondary | ICD-10-CM | POA: Diagnosis not present

## 2023-03-14 DIAGNOSIS — K219 Gastro-esophageal reflux disease without esophagitis: Secondary | ICD-10-CM

## 2023-03-14 DIAGNOSIS — J309 Allergic rhinitis, unspecified: Secondary | ICD-10-CM | POA: Diagnosis not present

## 2023-03-14 DIAGNOSIS — J302 Other seasonal allergic rhinitis: Secondary | ICD-10-CM

## 2023-03-14 MED ORDER — CETIRIZINE HCL 10 MG PO TABS: 10.0000 mg | ORAL_TABLET | Freq: Every day | ORAL | 5 refills | Status: AC

## 2023-03-14 MED ORDER — AZELASTINE HCL 0.1 % NA SOLN: NASAL | 5 refills | Status: AC

## 2023-03-14 MED ORDER — NEBULIZER/TUBING/MOUTHPIECE KIT: PACK | 0 refills | Status: AC

## 2023-03-14 MED ORDER — FLUTICASONE PROPIONATE 50 MCG/ACT NA SUSP: 1.0000 | Freq: Every day | NASAL | 5 refills | Status: AC

## 2023-03-14 MED ORDER — ALBUTEROL SULFATE HFA 108 (90 BASE) MCG/ACT IN AERS: 4.0000 | INHALATION_SPRAY | Freq: Four times a day (QID) | RESPIRATORY_TRACT | 1 refills | Status: DC | PRN

## 2023-03-14 NOTE — Progress Notes (Signed)
Immunotherapy   Patient Details  Name: Tracie Barnes MRN: [id number] Date of Birth: 1951/07/02  03/14/2023  Tracie Barnes started injections for  G-W-T-DM Following schedule: A  Frequency:1 time per week Epi-Pen:Epi-Pen Available  Consent signed and patient instructions given.  Patient re-started allergy injections today and received 0.94m of G-W-T-DM in the [facility]. Patient waited 30 minutes in office and did not experience any symptoms.   Sevin Langenbach Fernandez-Vernon 03/14/2023, 11:26 AM

## 2023-03-14 NOTE — Progress Notes (Signed)
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 Moderate persistent asthma, uncomplicated - Lung testing looked stable.  - We are also going to send in a new nebulizer as well.  - Daily controller medication(s):  - Prior to physical activity: albuterol 2 puffs 10-15 minutes before physical activity. - Rescue medications: albuterol 4 puffs every 4-6 hours as needed - Asthma control goals:  * Full participation in all desired activities (may need albuterol before activity) * Albuterol use two time or less a week on average (not counting use with activity) * Cough interfering with sleep two time or less a month * Oral steroids no more than once a year * No hospitalizations  2. Seasonal and perennial allergic rhinitis (grasses, ragweed, weeds, trees and dust mites) - Continue with allergy shots at the same schedule.  - I am fine with you coming twice weekly, if you are interested, in order to build up faster to your maintenance. - Continue with: Zyrtec (cetirizine) '10mg'$  tablet once daily and Flonase (fluticasone) one spray per nostril daily - Continue taking: Astelin (azelastine) 2 sprays per nostril 1-2 times daily as needed  3. Headaches - Make an appointment to follow up with Neurology @ [phone] (Dr. Krista Blue).    4. Follow up in 4 months or earlier if needed.     Subjective:   Tracie Barnes is a 72 y.o. female presenting today for follow up of  Chief Complaint  Patient presents with   Follow-up    Tracie Barnes has a history of the following: Patient Active Problem  List   Diagnosis Date Noted   Worsening headaches 03/28/2022   Chronic migraine w/o aura w/o status migrainosus, not intractable 03/28/2022   Seasonal and perennial allergic rhinitis 07/19/2021   Gastroesophageal reflux disease 07/19/2021   Moderate persistent asthma, uncomplicated [id number]   Globus sensation 07/19/2021   Acute cystitis 10/04/2020   Shortness of breath 06/15/2020   Other forms of systemic lupus erythematosus ([place]) 04/27/2020   History of colonic polyps    Avascular necrosis of bone ([place]) 11/17/2018   Pain in joint of right hip 09/02/2018   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  Tracie Barnes is a 72 y.o. female presenting for a follow up visit.  She was last seen in January 2023.  At that time, her lung testing looked worse which we felt was because she had been off of her controller medication.  We sent in a new prescription for Advair 115 mcg 2 puffs twice daily as well as albuterol as needed.  For her allergic rhinitis, she continued with allergy shots as well as Zyrtec, Flonase, and Astelin as needed.  We referred her to see [facility] Neurology for evaluation of her headaches.  Since last visit, she has stopped her allergy shots because she had COVID in the fall as well as her husband. Her husband ended up having to go  to the ED for abdominal pain. He actually went into cardiac arrest and he was out for 20+ minutes. He had three surgeries following this. Her husband is 67 years old (41 years of active duty). He was in the ICU for one month. He was in and out of the hospital now and he is walking and talking. 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 He had a lot of issues including an ostomy as well as a foley and several large wounds. He still has an ileostomy. She has  gotten an aide approved for 20 hours per week. He has Lewy Body dementia as well.   In addition, she was also scammed out of savings by some "[facility] friends".   Asthma/Respiratory Symptom History: She actually started "asthma injections". This is once monthly and she is using Nucala it sounds like or maybe Xolair.  She is getting these asthma injections at the [place]. She started those the month before COVID and she was a "different person". She is using her Spiriva two puffs once daily every day. She started that just today. This seems to be doing the trick. She has not needed her albuterol inhaler in a while, but she uses her albuterol nebulizer every 7-10 days. She feels that this is more effective than the MDI albuterol. She has not been on prednisone at all since her last visit. She tells me that her [place] asthma doctor provided her with prednisone to keep on hand. She used them up and has none now. It is unclear how much prednisone she has gotten. She is not coughing a lot at night. She has been coughing more today than she can remember.   Allergic Rhinitis Symptom History: She restarted her allergy shots today. She is planning to come weekly to infusions.  She remains on the cetrizine and the fluticasone in conjunction with the azelastine. She uses one in the morning and one in the evening. She has not needed any antibiotics for sinus infections at all.   Dijana is on allergen immunotherapy. She receives one injection. Immunotherapy script #1 contains trees, weeds, grasses, and dust mites. She currently receives 0.66m of the BLUE vial (1/100,000). She started shots May of 2022 and not yet reached maintenance. She stopped getting shots at some point last fall, but she is interested in restarting them now.  She got her first injection of her [employer] Engineeringtoday.   She was diagnosed with migraines after our referral last time. She has not followed up for this. She typically uses aspirin or extra strength Aleve or  ibuprofen. She needs to make a follow up appointment with that doctor.  Right now she just treats her headaches by taking a nap and taken her medications.  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 She has a son who is lives in [place] is the first [place] novelist to write a [place].   Otherwise, there have been no changes to her past medical history, surgical history, family history, or social history.    Review of Systems  Constitutional: Negative.  Negative for chills, fever, malaise/fatigue and weight loss.  HENT:  Positive for congestion. Negative for ear discharge, ear pain and sinus pain.   Eyes:  Negative for pain, discharge and  redness.  Respiratory:  Negative for cough, sputum production, shortness of breath and wheezing.   Cardiovascular: Negative.  Negative for chest pain and palpitations.  Gastrointestinal:  Negative for abdominal pain, constipation, diarrhea, heartburn, nausea and vomiting.  Skin: Negative.  Negative for itching and rash.  Neurological:  Positive for headaches. Negative for dizziness, tingling, tremors and sensory change.  Endo/Heme/Allergies:  Positive for environmental allergies. Does not bruise/bleed easily.       Objective:   Blood pressure 122/72, pulse (!) 103, temperature 98.7 F (37.1 C), temperature source Temporal, resp. rate 20, height 5' 4.75" (1.645 m), weight 148 lb 4.8 oz (67.3 kg), SpO2 100 %. Body mass index is 24.87 kg/m.    Physical Exam Vitals reviewed.  Constitutional:      Appearance: She is well-developed.     Comments: Well dressed.   HENT:     Head: Normocephalic and atraumatic.     Right Ear: Tympanic membrane, ear canal and external ear normal.     Left Ear: Tympanic membrane, ear canal and external ear normal.      Nose: No nasal deformity, septal deviation, mucosal edema or rhinorrhea.     Right Turbinates: Enlarged and swollen. Not pale.     Left Turbinates: Enlarged and swollen. Not pale.     Right Sinus: No maxillary sinus tenderness or frontal sinus tenderness.     Left Sinus: No maxillary sinus tenderness or frontal sinus tenderness.     Comments: There is tenderness bilaterally extending to the occipital regions    Mouth/Throat:     Mouth: Mucous membranes are not pale and not dry.     Pharynx: Uvula midline.  Eyes:     General: Lids are normal. Allergic shiner present.        Right eye: No discharge.        Left eye: No discharge.     Conjunctiva/sclera: Conjunctivae normal.     Right eye: Right conjunctiva is not injected. No chemosis.    Left eye: Left conjunctiva is not injected. No chemosis.    Pupils: Pupils are equal, round, and reactive to light.  Cardiovascular:     Rate and Rhythm: Normal rate and regular rhythm.     Heart sounds: Normal heart sounds.  Pulmonary:     Effort: Pulmonary effort is normal. No tachypnea, accessory muscle usage or respiratory distress.     Breath sounds: Normal breath sounds. No wheezing, rhonchi or rales.     Comments: Moving air well in all lung fields. Chest:     Chest wall: No tenderness.  Lymphadenopathy:     Cervical: No cervical adenopathy.  Skin:    General: Skin is warm.     Capillary Refill: Capillary refill takes less than 2 seconds.     Coloration: Skin is not pale.     Findings: No abrasion, erythema, petechiae or rash. Rash is not papular, urticarial or vesicular.     Comments: No eczematous or urticarial lesions noted.  Neurological:     Mental Status: She is alert.  Psychiatric:        Behavior: Behavior is cooperative.      Diagnostic studies:    Spirometry: results normal (FEV1: 1.63/123%, FVC: 2.72/163%, FEV1/FVC: 60%).    Spirometry consistent with mild obstructive disease.   Allergy Studies: none         Salvatore Marvel, MD  Allergy and [facility] [place]

## 2023-03-14 NOTE — Patient Instructions (Addendum)
1. Moderate persistent asthma, uncomplicated - Lung testing looked stable.  - We are also going to send in a new nebulizer as well.  - Daily controller medication(s):  - Prior to physical activity: albuterol 2 puffs 10-15 minutes before physical activity. - Rescue medications: albuterol 4 puffs every 4-6 hours as needed - Asthma control goals:  * Full participation in all desired activities (may need albuterol before activity) * Albuterol use two time or less a week on average (not counting use with activity) * Cough interfering with sleep two time or less a month * Oral steroids no more than once a year * No hospitalizations  2. Seasonal and perennial allergic rhinitis (grasses, ragweed, weeds, trees and dust mites) - Continue with allergy shots at the same schedule.  - I am fine with you coming twice weekly, if you are interested, in order to build up faster to your maintenance. - Continue with: Zyrtec (cetirizine) '10mg'$  tablet once daily and Flonase (fluticasone) one spray per nostril daily - Continue taking: Astelin (azelastine) 2 sprays per nostril 1-2 times daily as needed  3. Headaches - Make an appointment to follow up with Neurology @ [phone] (Dr. Krista Blue).    4. Follow up in 4 months or earlier if needed.    Please inform us of any Emergency Department visits, hospitalizations, or changes in symptoms. Call us before going to the ED for breathing or allergy symptoms since we might be able to fit you in for a sick visit. Feel free to contact us anytime with any questions, problems, or concerns.  It was a pleasure to see you again today!  Websites that have reliable patient information: 1. American Academy of Asthma, Allergy, and Immunology: www.aaaai.org 2. Food Allergy Research and Education (FARE): foodallergy.org 3. Mothers of Asthmatics: http://www.asthmacommunitynetwork.org 4. American College of Allergy, Asthma, and Immunology: www.acaai.org   COVID-19 Vaccine  Information can be found at: [email or web address].[email or web address].uk For questions related to vaccine distribution or appointments, please email [email or web address] or call [phone].   We realize that you might be concerned about having an allergic reaction to the COVID19 vaccines. To help with that concern, WE ARE OFFERING THE COVID19 VACCINES IN OUR OFFICE! Ask the front desk for dates!     "Like" [place] on Facebook and Instagram for our latest updates!      A healthy democracy works best when [employer] participate! Make sure you are registered to vote! If you have moved or changed any of your contact information, you will need to get this updated before voting!  In some cases, you MAY be able to register to vote online: [email or web address]

## 2023-03-25 ENCOUNTER — Ambulatory Visit: Payer: Medicare Other

## 2023-03-28 ENCOUNTER — Ambulatory Visit (INDEPENDENT_AMBULATORY_CARE_PROVIDER_SITE_OTHER): Payer: No Typology Code available for payment source

## 2023-03-28 DIAGNOSIS — J309 Allergic rhinitis, unspecified: Secondary | ICD-10-CM

## 2023-04-09 ENCOUNTER — Ambulatory Visit (INDEPENDENT_AMBULATORY_CARE_PROVIDER_SITE_OTHER): Payer: No Typology Code available for payment source | Admitting: *Deleted

## 2023-04-09 DIAGNOSIS — J309 Allergic rhinitis, unspecified: Secondary | ICD-10-CM

## 2023-04-15 ENCOUNTER — Ambulatory Visit (INDEPENDENT_AMBULATORY_CARE_PROVIDER_SITE_OTHER): Payer: No Typology Code available for payment source | Admitting: *Deleted

## 2023-04-15 DIAGNOSIS — J309 Allergic rhinitis, unspecified: Secondary | ICD-10-CM | POA: Diagnosis not present

## 2023-04-23 ENCOUNTER — Ambulatory Visit (INDEPENDENT_AMBULATORY_CARE_PROVIDER_SITE_OTHER): Payer: No Typology Code available for payment source

## 2023-04-23 DIAGNOSIS — J309 Allergic rhinitis, unspecified: Secondary | ICD-10-CM | POA: Diagnosis not present

## 2023-05-01 ENCOUNTER — Ambulatory Visit (INDEPENDENT_AMBULATORY_CARE_PROVIDER_SITE_OTHER): Payer: No Typology Code available for payment source | Admitting: *Deleted

## 2023-05-01 DIAGNOSIS — J309 Allergic rhinitis, unspecified: Secondary | ICD-10-CM | POA: Diagnosis not present

## 2023-05-08 ENCOUNTER — Ambulatory Visit (INDEPENDENT_AMBULATORY_CARE_PROVIDER_SITE_OTHER): Payer: No Typology Code available for payment source

## 2023-05-08 DIAGNOSIS — J309 Allergic rhinitis, unspecified: Secondary | ICD-10-CM | POA: Diagnosis not present

## 2023-05-23 ENCOUNTER — Ambulatory Visit (INDEPENDENT_AMBULATORY_CARE_PROVIDER_SITE_OTHER): Payer: No Typology Code available for payment source

## 2023-05-23 DIAGNOSIS — J309 Allergic rhinitis, unspecified: Secondary | ICD-10-CM

## 2023-05-30 ENCOUNTER — Ambulatory Visit (INDEPENDENT_AMBULATORY_CARE_PROVIDER_SITE_OTHER): Payer: No Typology Code available for payment source

## 2023-05-30 DIAGNOSIS — J309 Allergic rhinitis, unspecified: Secondary | ICD-10-CM

## 2023-06-06 ENCOUNTER — Ambulatory Visit (INDEPENDENT_AMBULATORY_CARE_PROVIDER_SITE_OTHER): Payer: No Typology Code available for payment source

## 2023-06-06 DIAGNOSIS — J309 Allergic rhinitis, unspecified: Secondary | ICD-10-CM

## 2023-06-11 ENCOUNTER — Ambulatory Visit (INDEPENDENT_AMBULATORY_CARE_PROVIDER_SITE_OTHER): Payer: No Typology Code available for payment source

## 2023-06-11 DIAGNOSIS — J309 Allergic rhinitis, unspecified: Secondary | ICD-10-CM

## 2023-06-20 ENCOUNTER — Ambulatory Visit (INDEPENDENT_AMBULATORY_CARE_PROVIDER_SITE_OTHER): Payer: No Typology Code available for payment source

## 2023-06-20 DIAGNOSIS — J309 Allergic rhinitis, unspecified: Secondary | ICD-10-CM

## 2023-06-26 ENCOUNTER — Ambulatory Visit (INDEPENDENT_AMBULATORY_CARE_PROVIDER_SITE_OTHER): Payer: No Typology Code available for payment source

## 2023-06-26 DIAGNOSIS — J309 Allergic rhinitis, unspecified: Secondary | ICD-10-CM

## 2023-07-11 ENCOUNTER — Ambulatory Visit (INDEPENDENT_AMBULATORY_CARE_PROVIDER_SITE_OTHER): Payer: No Typology Code available for payment source | Admitting: *Deleted

## 2023-07-11 DIAGNOSIS — J309 Allergic rhinitis, unspecified: Secondary | ICD-10-CM | POA: Diagnosis not present

## 2023-07-17 ENCOUNTER — Ambulatory Visit (INDEPENDENT_AMBULATORY_CARE_PROVIDER_SITE_OTHER): Payer: No Typology Code available for payment source | Admitting: *Deleted

## 2023-07-17 DIAGNOSIS — J309 Allergic rhinitis, unspecified: Secondary | ICD-10-CM

## 2023-07-30 ENCOUNTER — Ambulatory Visit (INDEPENDENT_AMBULATORY_CARE_PROVIDER_SITE_OTHER): Payer: No Typology Code available for payment source

## 2023-07-30 DIAGNOSIS — J309 Allergic rhinitis, unspecified: Secondary | ICD-10-CM | POA: Diagnosis not present

## 2023-08-07 ENCOUNTER — Telehealth: Payer: Self-pay | Admitting: Allergy & Immunology

## 2023-08-07 NOTE — Telephone Encounter (Signed)
Patient's current authorization, [id number] expires 09-04-2023.   Faxed renewal of authorization request to [facility], [phone] and emailed it to [email or web address].   Called patient to discuss - went to voicemail, left message to call back.

## 2023-08-09 ENCOUNTER — Ambulatory Visit (INDEPENDENT_AMBULATORY_CARE_PROVIDER_SITE_OTHER): Payer: No Typology Code available for payment source

## 2023-08-09 DIAGNOSIS — J309 Allergic rhinitis, unspecified: Secondary | ICD-10-CM | POA: Diagnosis not present

## 2023-08-14 NOTE — Telephone Encounter (Signed)
Received new authorization, [id number] with preliminary exp dates 08-13-2023 to 08-12-2024.   Patient's current authorization ending in [id number] expires 09-04-2023 and needs to be used for any injections that patient my receive before then.   New authorization has been indexed into media:  AMB REFERRRAL - [place] AUTH. PRELIMINARY EXP 08-12-2024

## 2023-08-20 ENCOUNTER — Ambulatory Visit (INDEPENDENT_AMBULATORY_CARE_PROVIDER_SITE_OTHER): Payer: No Typology Code available for payment source | Admitting: *Deleted

## 2023-08-20 DIAGNOSIS — J309 Allergic rhinitis, unspecified: Secondary | ICD-10-CM | POA: Diagnosis not present

## 2023-08-28 ENCOUNTER — Ambulatory Visit (INDEPENDENT_AMBULATORY_CARE_PROVIDER_SITE_OTHER): Payer: No Typology Code available for payment source | Admitting: *Deleted

## 2023-08-28 DIAGNOSIS — J309 Allergic rhinitis, unspecified: Secondary | ICD-10-CM

## 2023-09-06 ENCOUNTER — Ambulatory Visit (INDEPENDENT_AMBULATORY_CARE_PROVIDER_SITE_OTHER): Payer: No Typology Code available for payment source

## 2023-09-06 DIAGNOSIS — J309 Allergic rhinitis, unspecified: Secondary | ICD-10-CM | POA: Diagnosis not present

## 2023-09-06 IMAGING — CR DG CHEST 2V
1 series · 2 of 2 positions shown · non-contrast
Comparison: 03/17/2021

CLINICAL DATA: Cough, wheezing, possible asthma exacerbation, was
outside yesterday and has fallen allergies, sinus issues, congestion

EXAM:
CHEST - 2 VIEW

[Series 1: dg chest 2 view · 0.14mm/px · 2 of 2 slices shown]
[im 1/2]
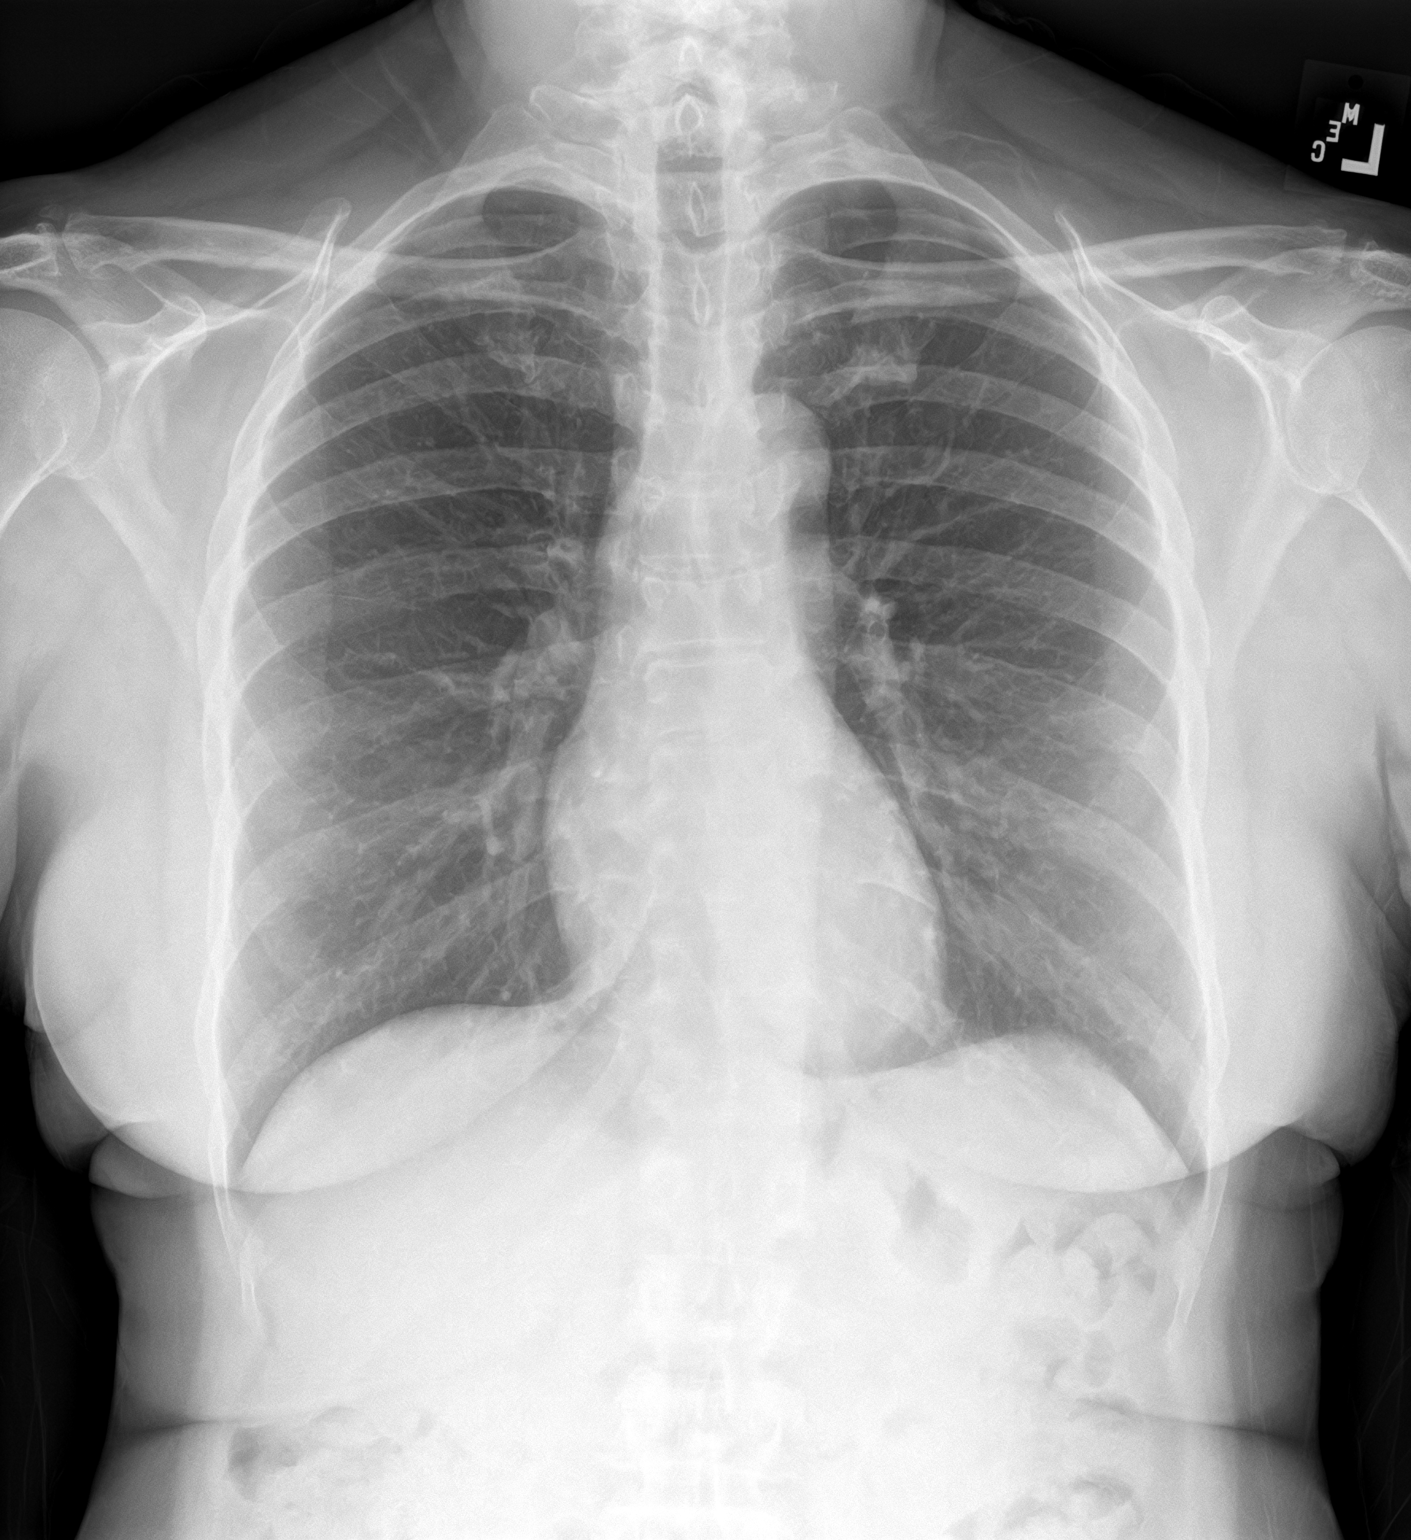
[im 2/2]
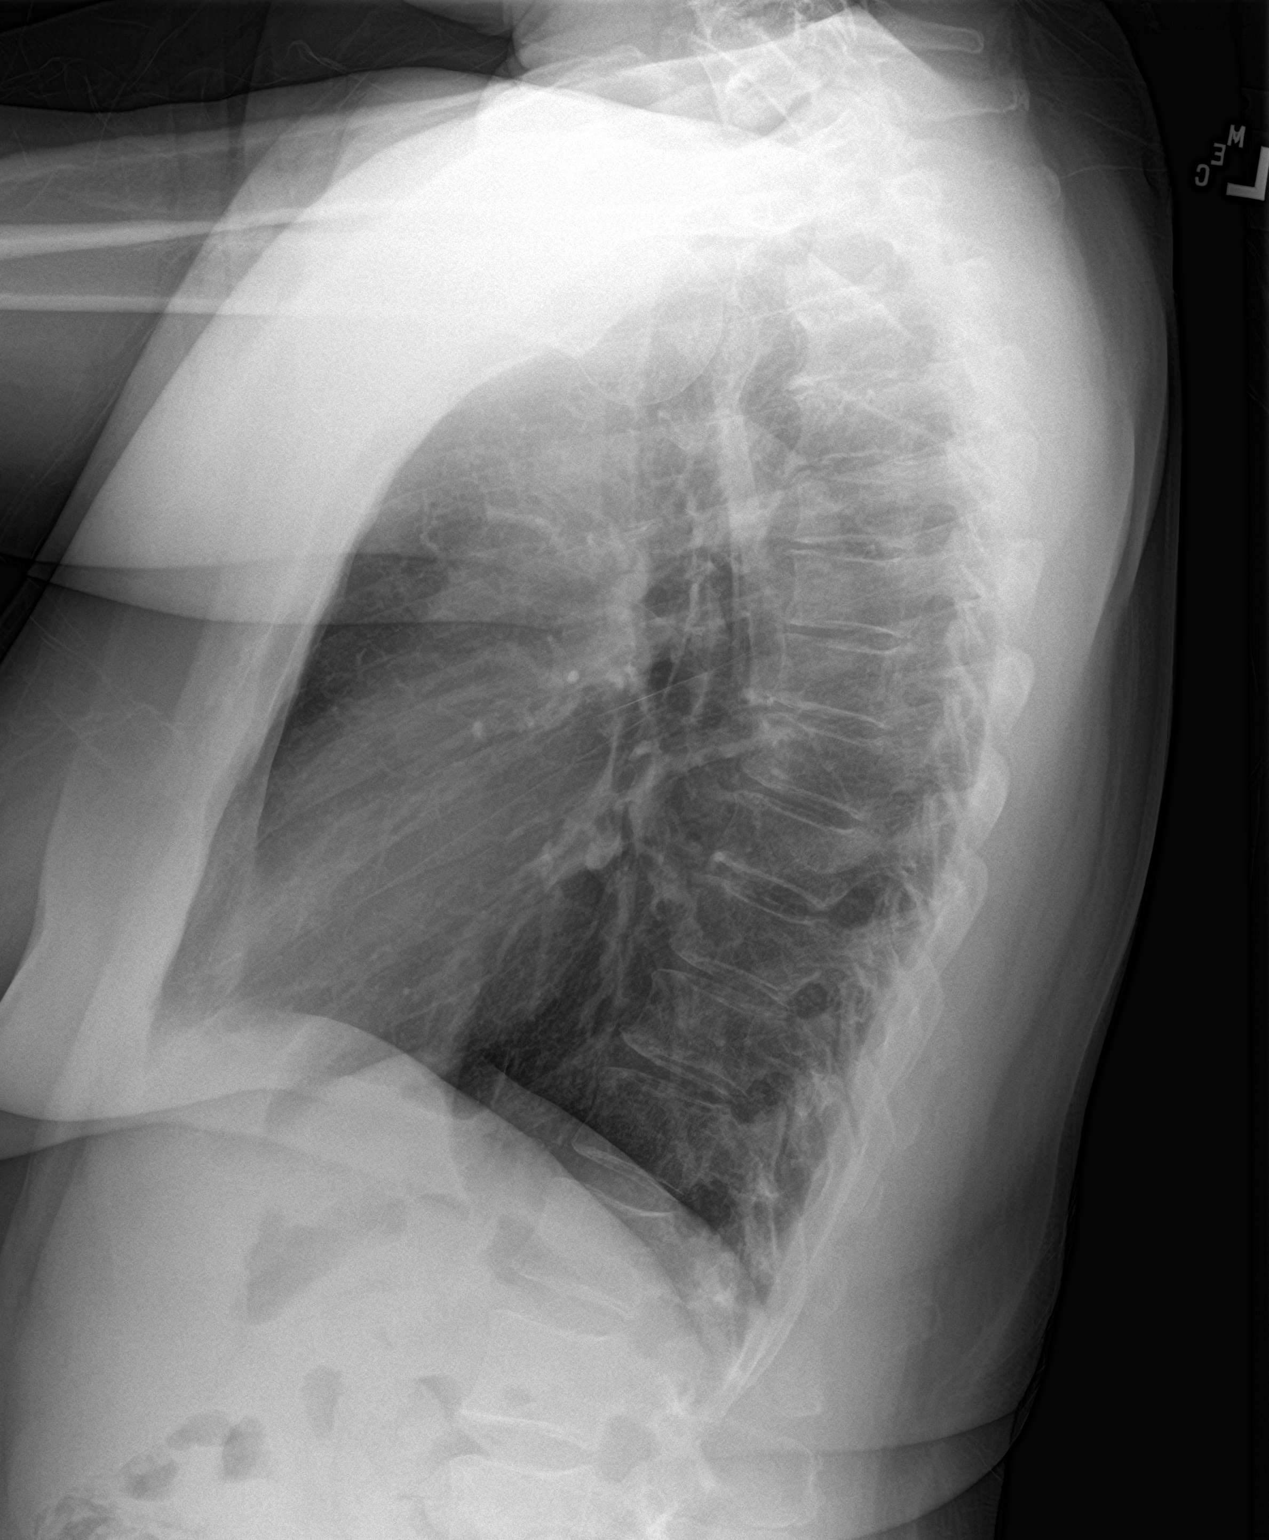

[2 of 2 positions shown; findings below may reference images not displayed]

FINDINGS: Normal heart size, mediastinal contours, and pulmonary vascularity.

Lungs clear.

No pleural effusion or pneumothorax.

Bones unremarkable.
IMPRESSION: Normal exam.

## 2023-09-13 ENCOUNTER — Ambulatory Visit (INDEPENDENT_AMBULATORY_CARE_PROVIDER_SITE_OTHER): Payer: No Typology Code available for payment source

## 2023-09-13 DIAGNOSIS — J309 Allergic rhinitis, unspecified: Secondary | ICD-10-CM

## 2023-09-26 ENCOUNTER — Ambulatory Visit (INDEPENDENT_AMBULATORY_CARE_PROVIDER_SITE_OTHER): Payer: No Typology Code available for payment source

## 2023-09-26 DIAGNOSIS — J309 Allergic rhinitis, unspecified: Secondary | ICD-10-CM | POA: Diagnosis not present

## 2023-10-08 ENCOUNTER — Ambulatory Visit (INDEPENDENT_AMBULATORY_CARE_PROVIDER_SITE_OTHER): Payer: No Typology Code available for payment source | Admitting: *Deleted

## 2023-10-08 DIAGNOSIS — J309 Allergic rhinitis, unspecified: Secondary | ICD-10-CM

## 2023-10-18 ENCOUNTER — Ambulatory Visit (INDEPENDENT_AMBULATORY_CARE_PROVIDER_SITE_OTHER): Payer: No Typology Code available for payment source

## 2023-10-18 DIAGNOSIS — J309 Allergic rhinitis, unspecified: Secondary | ICD-10-CM | POA: Diagnosis not present

## 2023-11-07 ENCOUNTER — Ambulatory Visit (INDEPENDENT_AMBULATORY_CARE_PROVIDER_SITE_OTHER): Payer: No Typology Code available for payment source

## 2023-11-07 DIAGNOSIS — J309 Allergic rhinitis, unspecified: Secondary | ICD-10-CM

## 2023-11-19 ENCOUNTER — Ambulatory Visit (INDEPENDENT_AMBULATORY_CARE_PROVIDER_SITE_OTHER): Payer: No Typology Code available for payment source | Admitting: *Deleted

## 2023-11-19 DIAGNOSIS — J309 Allergic rhinitis, unspecified: Secondary | ICD-10-CM

## 2023-12-03 ENCOUNTER — Ambulatory Visit (INDEPENDENT_AMBULATORY_CARE_PROVIDER_SITE_OTHER): Payer: No Typology Code available for payment source | Admitting: *Deleted

## 2023-12-03 DIAGNOSIS — J309 Allergic rhinitis, unspecified: Secondary | ICD-10-CM

## 2024-01-21 ENCOUNTER — Other Ambulatory Visit: Payer: Self-pay

## 2024-01-21 ENCOUNTER — Ambulatory Visit (INDEPENDENT_AMBULATORY_CARE_PROVIDER_SITE_OTHER): Payer: No Typology Code available for payment source | Admitting: Allergy & Immunology

## 2024-01-21 ENCOUNTER — Encounter: Payer: Self-pay | Admitting: Allergy & Immunology

## 2024-01-21 VITALS — BP 130/60 | HR 99 | Temp 98.6°F | Resp 16 | Ht 64.0 in | Wt 142.6 lb

## 2024-01-21 DIAGNOSIS — J3089 Other allergic rhinitis: Secondary | ICD-10-CM

## 2024-01-21 DIAGNOSIS — J4540 Moderate persistent asthma, uncomplicated: Secondary | ICD-10-CM | POA: Diagnosis not present

## 2024-01-21 DIAGNOSIS — J302 Other seasonal allergic rhinitis: Secondary | ICD-10-CM

## 2024-01-21 DIAGNOSIS — K219 Gastro-esophageal reflux disease without esophagitis: Secondary | ICD-10-CM

## 2024-01-21 NOTE — Progress Notes (Unsigned)
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 Moderate persistent asthma, uncomplicated - Lung testing looked excellent.  - We are not going to make any changes at all.  - Daily controller medication(s): Advair 115/21 mcg two puffs TWICE DAILY + Spiriva 1.43mcg two puffs ONCE DAILY + Nucala every month - Prior to physical activity: albuterol 2 puffs 10-15 minutes before physical activity. - Rescue medications: albuterol 4 puffs every 4-6 hours as needed - Asthma control goals:  * Full participation in all desired activities (may need albuterol before activity) * Albuterol use two time or less a week on average (not counting use with activity) * Cough interfering with sleep two time or less a month * Oral steroids no more than once a year * No hospitalizations  2. Seasonal and perennial allergic rhinitis (grasses, ragweed, weeds, trees and dust mites) - I would hate to stop the shots since you are noticing improvement in your symptoms. - Let's do shots every two weeks and build up more slowly.  - Continue with: Zyrtec (cetirizine) 10mg  tablet once daily and Flonase (fluticasone) one spray per nostril daily - Continue taking: Astelin (azelastine) 2 sprays per nostril 1-2 times daily as needed  3. Headaches - Make an appointment to follow up with Neurology @ [phone] (Dr. Terrace Arabia).    4. Follow up in 3 months or earlier if needed.    Subjective:   Tracie Barnes is a 73 y.o. female presenting  today for follow up of  Chief Complaint  Patient presents with   Follow-up    Tracie Barnes has a history of the following: Patient Active Problem List   Diagnosis Date Noted   Worsening headaches 03/28/2022   Chronic migraine w/o aura w/o status migrainosus, not intractable 03/28/2022   Seasonal and perennial allergic rhinitis 07/19/2021   Gastroesophageal reflux disease 07/19/2021   Moderate persistent asthma, uncomplicated 07/19/2021   Globus sensation 07/19/2021   Acute cystitis 10/04/2020   Shortness of breath 06/15/2020   Other forms of systemic lupus erythematosus (HCC) 04/27/2020   History of colonic polyps    Avascular necrosis of bone (HCC) 11/17/2018   Pain in joint of right hip 09/02/2018   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  Discussed the use of AI scribe software for clinical note transcription with the patient and/or guardian, who gave verbal consent to proceed.  Tracie Barnes is a 73 y.o. female presenting for a follow up visit.  She was last seen in March 2024.  At that time, her lung testing was stable,.  We continue with albuterol as needed.  For her seasonal and perennial allergic rhinitis, we continue with Zyrtec as well as Astelin.  She was going to see neurology for her headaches.  Since last visit, she has done well.  Asthma/Respiratory Symptom History: Tracie Barnes  has improved respiratory symptoms with the current regimen of color-coded inhalers and nasal sprays. She takes a white and brown nasal spray in the morning and twice daily uses a purple inhaler (likely Advair) and a teal inhaler (likely Spiriva). She also receives monthly injections for asthma, which she reports have significantly improved her breathing and reduced the severity of her sinus headaches. This is  Nucala, which he receives at the [place].  Allergic Rhinitis Symptom History: She also has a history of allergies and was receiving weekly allergy shots. However, she noticed a correlation between the shots and the occurrence of electrical shock-like sensations in her brain. These sensations were particularly severe when she received her allergy shot and asthma infusion in the same week. The patient decided to stop the allergy shots around the end of November and reports that the sensations have since disappeared.  However, she was receiving improvement in her symptoms with allergy shots, schedule appointment.  She is wondering if there is something she can do to lessen the reaction.  She has not tried spacing them out helps.  She is willing to try anything.  Of note, she has not gone to see neurology for evaluation of her headaches.  Tracie Barnes is on allergen immunotherapy. She receives one injection. Immunotherapy script #1 contains trees, weeds, grasses, and dust mites. She currently receives 0.31mL of the GOLD vial (1/10,000). She started shots May of 2022 and not yet reached maintenance. She has the bizarre side effects as discussed above.  She also has a history of lupus, which she believes originated from exposure to chemicals in underground wells during her time in the [employer]. She reports that her face has been swollen for the past fifty years, likely due to this initial exposure. Despite these health challenges, the patient describes herself as a tough individual who continues to push through her symptoms.   Otherwise, there have been no changes to her past medical history, surgical history, family history, or social history.    Review of systems otherwise negative other than that mentioned in the HPI.    Objective:   Blood pressure 130/60, pulse 99, temperature 98.6 F (37 C), temperature source Temporal, resp. rate 16, height 5\' 4"  (1.626 m), weight 142 lb 9.6 oz (64.7 kg), SpO2 98%. Body mass  index is 24.48 kg/m.    Physical Exam Vitals reviewed.  Constitutional:      Appearance: She is well-developed.     Comments: Well dressed. Friendly.   HENT:     Head: Normocephalic and atraumatic.     Right Ear: Tympanic membrane, ear canal and external ear normal.     Left Ear: Tympanic membrane, ear canal and external ear normal.     Nose: No nasal deformity, septal deviation, mucosal edema or rhinorrhea.     Right Turbinates: Enlarged, swollen and pale.     Left Turbinates: Enlarged, swollen and pale.     Right Sinus: No maxillary sinus tenderness or frontal sinus tenderness.     Left Sinus: No maxillary sinus tenderness or frontal sinus tenderness.     Comments: There is tenderness bilaterally extending to the occipital regions    Mouth/Throat:     Mouth: Mucous membranes are not pale and not dry.     Pharynx: Uvula midline.  Eyes:     General: Lids are normal. Allergic shiner present.        Right eye: No discharge.        Left eye: No discharge.  Conjunctiva/sclera: Conjunctivae normal.     Right eye: Right conjunctiva is not injected. No chemosis.    Left eye: Left conjunctiva is not injected. No chemosis.    Pupils: Pupils are equal, round, and reactive to light.  Cardiovascular:     Rate and Rhythm: Normal rate and regular rhythm.     Heart sounds: Normal heart sounds.  Pulmonary:     Effort: Pulmonary effort is normal. No tachypnea, accessory muscle usage or respiratory distress.     Breath sounds: Normal breath sounds. No wheezing, rhonchi or rales.     Comments: Moving air well in all lung fields. Chest:     Chest wall: No tenderness.  Lymphadenopathy:     Cervical: No cervical adenopathy.  Skin:    General: Skin is warm.     Capillary Refill: Capillary refill takes less than 2 seconds.     Coloration: Skin is not pale.     Findings: No abrasion, erythema, petechiae or rash. Rash is not papular, urticarial or vesicular.     Comments: No eczematous or  urticarial lesions noted.  Neurological:     Mental Status: She is alert.  Psychiatric:        Behavior: Behavior is cooperative.      Diagnostic studies:    Spirometry: results normal (FEV1: 2.22/123%, FVC: 2.67/114%, FEV1/FVC: 83%).    Spirometry consistent with normal pattern.   Allergy Studies:  none       Malachi Bonds, MD  [facility]

## 2024-01-21 NOTE — Patient Instructions (Addendum)
1. Moderate persistent asthma, uncomplicated - Lung testing looked excellent.  - We are not going to make any changes at all.  - Daily controller medication(s): Advair 115/21 mcg two puffs TWICE DAILY + Spiriva 1.16mcg two puffs ONCE DAILY + Nucala every month - Prior to physical activity: albuterol 2 puffs 10-15 minutes before physical activity. - Rescue medications: albuterol 4 puffs every 4-6 hours as needed - Asthma control goals:  * Full participation in all desired activities (may need albuterol before activity) * Albuterol use two time or less a week on average (not counting use with activity) * Cough interfering with sleep two time or less a month * Oral steroids no more than once a year * No hospitalizations  2. Seasonal and perennial allergic rhinitis (grasses, ragweed, weeds, trees and dust mites) - I would hate to stop the shots since you are noticing improvement in your symptoms. - Let's do shots every two weeks and build up more slowly.  - Continue with: Zyrtec (cetirizine) 10mg  tablet once daily and Flonase (fluticasone) one spray per nostril daily - Continue taking: Astelin (azelastine) 2 sprays per nostril 1-2 times daily as needed  3. Headaches - Make an appointment to follow up with Neurology @ [phone] (Dr. Terrace Arabia).    4. Follow up in 3 months or earlier if needed.    Please inform us of any Emergency Department visits, hospitalizations, or changes in symptoms. Call us before going to the ED for breathing or allergy symptoms since we might be able to fit you in for a sick visit. Feel free to contact us anytime with any questions, problems, or concerns.  It was a pleasure to see you again today!  Websites that have reliable patient information: 1. American Academy of Asthma, Allergy, and Immunology: www.aaaai.org 2. Food Allergy Research and Education (FARE): foodallergy.org 3. Mothers of Asthmatics: http://www.asthmacommunitynetwork.org 4. American College of  Allergy, Asthma, and Immunology: www.acaai.org   COVID-19 Vaccine Information can be found at: [email or web address] For questions related to vaccine distribution or appointments, please email [email or web address] or call [phone].   We realize that you might be concerned about having an allergic reaction to the COVID19 vaccines. To help with that concern, WE ARE OFFERING THE COVID19 VACCINES IN OUR OFFICE! Ask the front desk for dates!     "Like" [place] on Facebook and Instagram for our latest updates!      A healthy democracy works best when [employer] participate! Make sure you are registered to vote! If you have moved or changed any of your contact information, you will need to get this updated before voting!  In some cases, you MAY be able to register to vote online: [email or web address]

## 2024-01-22 ENCOUNTER — Encounter: Payer: Self-pay | Admitting: Allergy & Immunology

## 2024-01-22 ENCOUNTER — Telehealth: Payer: Self-pay | Admitting: *Deleted

## 2024-01-22 NOTE — Telephone Encounter (Signed)
Patient's allergy flow sheet has been updated to reflect these changes.

## 2024-01-22 NOTE — Telephone Encounter (Signed)
-----   Message from Nurse Vonzell Schlatter sent at 01/22/2024 10:21 AM EST -----  ----- Message ----- From: Alfonse Spruce, MD Sent: 01/22/2024   9:18 AM EST To: Dub Mikes, LPN  She is going to get her allergy shots every 2 weeks. Ok to continue to advance.

## 2024-02-17 ENCOUNTER — Other Ambulatory Visit: Payer: Self-pay | Admitting: Allergy & Immunology

## 2024-02-17 DIAGNOSIS — J329 Chronic sinusitis, unspecified: Secondary | ICD-10-CM

## 2024-02-25 ENCOUNTER — Ambulatory Visit
Admission: RE | Admit: 2024-02-25 | Discharge: 2024-02-25 | Disposition: A | Discharge status: Home / Self Care | Payer: No Typology Code available for payment source | Source: Ambulatory Visit | Attending: Allergy & Immunology | Admitting: Allergy & Immunology

## 2024-02-25 DIAGNOSIS — J329 Chronic sinusitis, unspecified: Secondary | ICD-10-CM | POA: Insufficient documentation

## 2024-04-03 ENCOUNTER — Ambulatory Visit: Payer: Self-pay

## 2024-04-03 NOTE — Telephone Encounter (Signed)
 Patient came in for injection today. Vials expired on 03/11/2024.  Patient does want to restart. Please advice.

## 2024-04-03 NOTE — Telephone Encounter (Signed)
 I am fine with restarting. Does she do this often?   Malachi Bonds, MD [facility]

## 2024-04-06 NOTE — Telephone Encounter (Signed)
 She has been on allergy injections since 04/2021 and has never made it our of [facility]. This will be her last restart.

## 2024-04-07 NOTE — Telephone Encounter (Signed)
 Noted and agree. Be sure to put that somewhere on the flow sheet.

## 2024-04-13 DIAGNOSIS — J3089 Other allergic rhinitis: Secondary | ICD-10-CM | POA: Diagnosis not present

## 2024-04-28 ENCOUNTER — Ambulatory Visit

## 2024-05-06 ENCOUNTER — Ambulatory Visit (INDEPENDENT_AMBULATORY_CARE_PROVIDER_SITE_OTHER)

## 2024-05-06 DIAGNOSIS — J309 Allergic rhinitis, unspecified: Secondary | ICD-10-CM | POA: Diagnosis not present

## 2024-05-22 ENCOUNTER — Ambulatory Visit (INDEPENDENT_AMBULATORY_CARE_PROVIDER_SITE_OTHER)

## 2024-05-22 DIAGNOSIS — J309 Allergic rhinitis, unspecified: Secondary | ICD-10-CM

## 2024-06-02 ENCOUNTER — Ambulatory Visit (INDEPENDENT_AMBULATORY_CARE_PROVIDER_SITE_OTHER)

## 2024-06-02 DIAGNOSIS — J309 Allergic rhinitis, unspecified: Secondary | ICD-10-CM

## 2024-07-21 ENCOUNTER — Ambulatory Visit: Payer: Medicare Other | Admitting: Allergy & Immunology

## 2024-08-13 ENCOUNTER — Telehealth: Payer: Self-pay | Admitting: Allergy & Immunology

## 2024-08-13 NOTE — Telephone Encounter (Signed)
 Spoke with pt and informed her current authorization expires on 09/05/2024 and a new authorization has been sent in and for her to call the [facility] for a new authorization.

## 2024-08-16 ENCOUNTER — Encounter (HOSPITAL_COMMUNITY)
Admission: EM | Disposition: A | Discharge status: Home / Self Care | Payer: Self-pay | Source: Other Acute Inpatient Hospital | Attending: Emergency Medicine

## 2024-08-16 ENCOUNTER — Emergency Department (HOSPITAL_COMMUNITY): Admitting: Anesthesiology

## 2024-08-16 ENCOUNTER — Ambulatory Visit (HOSPITAL_COMMUNITY)
Admission: EM | Admit: 2024-08-16 | Discharge: 2024-08-16 | Disposition: A | Discharge status: Home / Self Care | Source: Other Acute Inpatient Hospital | Attending: Emergency Medicine | Admitting: Emergency Medicine

## 2024-08-16 ENCOUNTER — Encounter (HOSPITAL_COMMUNITY): Payer: Self-pay | Admitting: Certified Registered Nurse Anesthetist

## 2024-08-16 ENCOUNTER — Emergency Department
Admission: EM | Admit: 2024-08-16 | Discharge: 2024-08-16 | Disposition: A | Discharge status: Home / Self Care | Attending: Emergency Medicine | Admitting: Emergency Medicine

## 2024-08-16 ENCOUNTER — Other Ambulatory Visit: Payer: Self-pay

## 2024-08-16 ENCOUNTER — Encounter: Payer: Self-pay | Admitting: Emergency Medicine

## 2024-08-16 ENCOUNTER — Emergency Department

## 2024-08-16 ENCOUNTER — Emergency Department (HOSPITAL_BASED_OUTPATIENT_CLINIC_OR_DEPARTMENT_OTHER): Admitting: Anesthesiology

## 2024-08-16 DIAGNOSIS — S0181XA Laceration without foreign body of other part of head, initial encounter: Secondary | ICD-10-CM | POA: Insufficient documentation

## 2024-08-16 DIAGNOSIS — I10 Essential (primary) hypertension: Secondary | ICD-10-CM | POA: Diagnosis not present

## 2024-08-16 DIAGNOSIS — S02602A Fracture of unspecified part of body of left mandible, initial encounter for closed fracture: Secondary | ICD-10-CM | POA: Diagnosis not present

## 2024-08-16 DIAGNOSIS — S02672A Fracture of alveolus of left mandible, initial encounter for closed fracture: Secondary | ICD-10-CM

## 2024-08-16 DIAGNOSIS — Z23 Encounter for immunization: Secondary | ICD-10-CM | POA: Diagnosis not present

## 2024-08-16 DIAGNOSIS — J45909 Unspecified asthma, uncomplicated: Secondary | ICD-10-CM | POA: Diagnosis not present

## 2024-08-16 DIAGNOSIS — W182XXA Fall in (into) shower or empty bathtub, initial encounter: Secondary | ICD-10-CM | POA: Insufficient documentation

## 2024-08-16 DIAGNOSIS — W19XXXA Unspecified fall, initial encounter: Secondary | ICD-10-CM | POA: Diagnosis not present

## 2024-08-16 DIAGNOSIS — R55 Syncope and collapse: Secondary | ICD-10-CM | POA: Diagnosis not present

## 2024-08-16 DIAGNOSIS — Y92002 Bathroom of unspecified non-institutional (private) residence single-family (private) house as the place of occurrence of the external cause: Secondary | ICD-10-CM | POA: Diagnosis not present

## 2024-08-16 DIAGNOSIS — S0993XA Unspecified injury of face, initial encounter: Secondary | ICD-10-CM | POA: Diagnosis present

## 2024-08-16 DIAGNOSIS — S02609B Fracture of mandible, unspecified, initial encounter for open fracture: Secondary | ICD-10-CM

## 2024-08-16 HISTORY — DX: Prediabetes: R73.03

## 2024-08-16 HISTORY — PX: ORIF MANDIBULAR FRACTURE: SHX2127

## 2024-08-16 HISTORY — DX: Chronic kidney disease, unspecified: N18.9

## 2024-08-16 HISTORY — DX: Dyspnea, unspecified: R06.00

## 2024-08-16 LAB — URINALYSIS, COMPLETE (UACMP) WITH MICROSCOPIC
Bacteria, UA: NONE SEEN
Bilirubin Urine: NEGATIVE
Glucose, UA: NEGATIVE mg/dL
Hgb urine dipstick: NEGATIVE
Ketones, ur: NEGATIVE mg/dL
Leukocytes,Ua: NEGATIVE
Nitrite: NEGATIVE
Protein, ur: NEGATIVE mg/dL
Specific Gravity, Urine: 1.014 (ref 1.005–1.030)
pH: 6 (ref 5.0–8.0)

## 2024-08-16 LAB — CBC WITH DIFFERENTIAL/PLATELET
Abs Immature Granulocytes: 0.01 K/uL (ref 0.00–0.07)
Basophils Absolute: 0 K/uL (ref 0.0–0.1)
Basophils Relative: 0 %
Eosinophils Absolute: 0 K/uL (ref 0.0–0.5)
Eosinophils Relative: 1 %
HCT: 38.3 % (ref 36.0–46.0)
Hemoglobin: 13.1 g/dL (ref 12.0–15.0)
Immature Granulocytes: 0 %
Lymphocytes Relative: 27 %
Lymphs Abs: 1.6 K/uL (ref 0.7–4.0)
MCH: 32.7 pg (ref 26.0–34.0)
MCHC: 34.2 g/dL (ref 30.0–36.0)
MCV: 95.5 fL (ref 80.0–100.0)
Monocytes Absolute: 0.5 K/uL (ref 0.1–1.0)
Monocytes Relative: 9 %
Neutro Abs: 3.9 K/uL (ref 1.7–7.7)
Neutrophils Relative %: 63 %
Platelets: 200 K/uL (ref 150–400)
RBC: 4.01 MIL/uL (ref 3.87–5.11)
RDW: 12.7 % (ref 11.5–15.5)
WBC: 6.1 K/uL (ref 4.0–10.5)
nRBC: 0 % (ref 0.0–0.2)

## 2024-08-16 LAB — BASIC METABOLIC PANEL WITH GFR
Anion gap: 7 (ref 5–15)
BUN: 15 mg/dL (ref 8–23)
CO2: 30 mmol/L (ref 22–32)
Calcium: 8.5 mg/dL — ABNORMAL LOW (ref 8.9–10.3)
Chloride: 101 mmol/L (ref 98–111)
Creatinine, Ser: 1.25 mg/dL — ABNORMAL HIGH (ref 0.44–1.00)
GFR, Estimated: 45 mL/min — ABNORMAL LOW (ref >60)
Glucose, Bld: 132 mg/dL — ABNORMAL HIGH (ref 70–99)
Potassium: 3.3 mmol/L — ABNORMAL LOW (ref 3.5–5.1)
Sodium: 138 mmol/L (ref 135–145)

## 2024-08-16 LAB — TROPONIN I (HIGH SENSITIVITY)
Troponin I (High Sensitivity): 4 ng/L (ref <18)
Troponin I (High Sensitivity): 4 ng/L (ref <18)

## 2024-08-16 LAB — PROTIME-INR
INR: 1.1 (ref 0.8–1.2)
Prothrombin Time: 15.1 s (ref 11.4–15.2)

## 2024-08-16 SURGERY — OPEN REDUCTION INTERNAL FIXATION (ORIF) MANDIBULAR FRACTURE
Anesthesia: General

## 2024-08-16 MED ORDER — DEXAMETHASONE SODIUM PHOSPHATE 10 MG/ML IJ SOLN
INTRAMUSCULAR | Status: AC
  Filled 2024-08-16: qty 1

## 2024-08-16 MED ORDER — LIDOCAINE 2% (20 MG/ML) 5 ML SYRINGE
INTRAMUSCULAR | Status: DC | PRN
  Administered 2024-08-16: 60 mg via INTRAVENOUS

## 2024-08-16 MED ORDER — PROPOFOL 10 MG/ML IV BOLUS
INTRAVENOUS | Status: AC
  Filled 2024-08-16: qty 20

## 2024-08-16 MED ORDER — FENTANYL CITRATE (PF) 100 MCG/2ML IJ SOLN
INTRAMUSCULAR | Status: AC
  Filled 2024-08-16: qty 2

## 2024-08-16 MED ORDER — PHENYLEPHRINE 80 MCG/ML (10ML) SYRINGE FOR IV PUSH (FOR BLOOD PRESSURE SUPPORT)
PREFILLED_SYRINGE | INTRAVENOUS | Status: AC
  Filled 2024-08-16: qty 10

## 2024-08-16 MED ORDER — DEXAMETHASONE SODIUM PHOSPHATE 10 MG/ML IJ SOLN
INTRAMUSCULAR | Status: DC | PRN
  Administered 2024-08-16: 5 mg via INTRAVENOUS

## 2024-08-16 MED ORDER — LIDOCAINE 2% (20 MG/ML) 5 ML SYRINGE
INTRAMUSCULAR | Status: AC
  Filled 2024-08-16: qty 5

## 2024-08-16 MED ORDER — LIDOCAINE-EPINEPHRINE (PF) 2 %-1:200000 IJ SOLN
20.0000 mL | Freq: Once | INTRAMUSCULAR | Status: AC
  Administered 2024-08-16: 20 mL via INTRADERMAL
  Filled 2024-08-16: qty 20

## 2024-08-16 MED ORDER — PROPOFOL 10 MG/ML IV BOLUS
INTRAVENOUS | Status: DC | PRN
  Administered 2024-08-16: 130 mg via INTRAVENOUS

## 2024-08-16 MED ORDER — ACETAMINOPHEN 500 MG PO TABS
1000.0000 mg | ORAL_TABLET | Freq: Once | ORAL | Status: AC
  Administered 2024-08-16: 1000 mg via ORAL
  Filled 2024-08-16: qty 2

## 2024-08-16 MED ORDER — ACETAMINOPHEN 10 MG/ML IV SOLN
INTRAVENOUS | Status: DC | PRN
  Administered 2024-08-16: 1000 mg via INTRAVENOUS

## 2024-08-16 MED ORDER — FENTANYL CITRATE (PF) 250 MCG/5ML IJ SOLN
INTRAMUSCULAR | Status: AC
  Filled 2024-08-16: qty 5

## 2024-08-16 MED ORDER — OXYMETAZOLINE HCL 0.05 % NA SOLN
NASAL | Status: DC | PRN
  Administered 2024-08-16: 2 via NASAL

## 2024-08-16 MED ORDER — 0.9 % SODIUM CHLORIDE (POUR BTL) OPTIME
TOPICAL | Status: DC | PRN
  Administered 2024-08-16: 1000 mL

## 2024-08-16 MED ORDER — SUGAMMADEX SODIUM 200 MG/2ML IV SOLN
INTRAVENOUS | Status: DC | PRN
  Administered 2024-08-16: 150 mg via INTRAVENOUS

## 2024-08-16 MED ORDER — CLINDAMYCIN PHOSPHATE 600 MG/50ML IV SOLN
INTRAVENOUS | Status: AC
  Filled 2024-08-16: qty 50

## 2024-08-16 MED ORDER — CLINDAMYCIN HCL 300 MG PO CAPS: 300.0000 mg | ORAL_CAPSULE | Freq: Three times a day (TID) | ORAL | 0 refills | Status: AC

## 2024-08-16 MED ORDER — CLINDAMYCIN HCL 300 MG PO CAPS: 300.0000 mg | ORAL_CAPSULE | Freq: Three times a day (TID) | ORAL | 0 refills | Status: DC

## 2024-08-16 MED ORDER — CHLORHEXIDINE GLUCONATE 0.12 % MT SOLN
15.0000 mL | Freq: Once | OROMUCOSAL | Status: DC
Start: 2024-08-16 — End: 2024-08-17

## 2024-08-16 MED ORDER — CLINDAMYCIN PHOSPHATE 600 MG/50ML IV SOLN
INTRAVENOUS | Status: DC | PRN
  Administered 2024-08-16: 600 mg via INTRAVENOUS

## 2024-08-16 MED ORDER — HYDROCODONE-ACETAMINOPHEN 5-325 MG PO TABS
1.0000 | ORAL_TABLET | Freq: Once | ORAL | Status: AC
  Administered 2024-08-16: 1 via ORAL
  Filled 2024-08-16: qty 1

## 2024-08-16 MED ORDER — ROCURONIUM BROMIDE 10 MG/ML (PF) SYRINGE
PREFILLED_SYRINGE | INTRAVENOUS | Status: DC | PRN
  Administered 2024-08-16: 10 mg via INTRAVENOUS
  Administered 2024-08-16: 40 mg via INTRAVENOUS
  Administered 2024-08-16: 10 mg via INTRAVENOUS

## 2024-08-16 MED ORDER — ONDANSETRON HCL 4 MG/2ML IJ SOLN
INTRAMUSCULAR | Status: DC | PRN
  Administered 2024-08-16: 4 mg via INTRAVENOUS

## 2024-08-16 MED ORDER — HYDROCODONE-ACETAMINOPHEN 7.5-325 MG/15ML PO SOLN: 10.0000 mL | Freq: Four times a day (QID) | ORAL | 0 refills | Status: AC | PRN

## 2024-08-16 MED ORDER — AMISULPRIDE (ANTIEMETIC) 5 MG/2ML IV SOLN: 10.0000 mg | Freq: Once | INTRAVENOUS | Status: DC | PRN

## 2024-08-16 MED ORDER — ROCURONIUM BROMIDE 10 MG/ML (PF) SYRINGE
PREFILLED_SYRINGE | INTRAVENOUS | Status: AC
  Filled 2024-08-16: qty 10

## 2024-08-16 MED ORDER — TETANUS-DIPHTH-ACELL PERTUSSIS 5-2.5-18.5 LF-MCG/0.5 IM SUSY
0.5000 mL | PREFILLED_SYRINGE | Freq: Once | INTRAMUSCULAR | Status: AC
  Administered 2024-08-16: 0.5 mL via INTRAMUSCULAR
  Filled 2024-08-16: qty 0.5

## 2024-08-16 MED ORDER — SODIUM CHLORIDE 0.9 % IV BOLUS
500.0000 mL | Freq: Once | INTRAVENOUS | Status: AC
  Administered 2024-08-16: 500 mL via INTRAVENOUS

## 2024-08-16 MED ORDER — LACTATED RINGERS IV SOLN: INTRAVENOUS | Status: DC

## 2024-08-16 MED ORDER — MORPHINE SULFATE (PF) 2 MG/ML IV SOLN
2.0000 mg | Freq: Once | INTRAVENOUS | Status: AC
  Administered 2024-08-16: 2 mg via INTRAVENOUS
  Filled 2024-08-16: qty 1

## 2024-08-16 MED ORDER — OXYMETAZOLINE HCL 0.05 % NA SOLN
NASAL | Status: AC
Start: 2024-08-16 — End: 2024-08-16
  Filled 2024-08-16: qty 30

## 2024-08-16 MED ORDER — ORAL CARE MOUTH RINSE
15.0000 mL | Freq: Once | OROMUCOSAL | Status: DC
Start: 2024-08-16 — End: 2024-08-17

## 2024-08-16 MED ORDER — LIDOCAINE-EPINEPHRINE 1 %-1:100000 IJ SOLN
INTRAMUSCULAR | Status: DC | PRN
  Administered 2024-08-16: 5 mL

## 2024-08-16 MED ORDER — LIDOCAINE-EPINEPHRINE 1 %-1:100000 IJ SOLN
INTRAMUSCULAR | Status: AC
  Filled 2024-08-16: qty 1

## 2024-08-16 MED ORDER — FENTANYL CITRATE (PF) 250 MCG/5ML IJ SOLN
INTRAMUSCULAR | Status: DC | PRN
  Administered 2024-08-16: 50 ug via INTRAVENOUS

## 2024-08-16 MED ORDER — FENTANYL CITRATE (PF) 100 MCG/2ML IJ SOLN
25.0000 ug | INTRAMUSCULAR | Status: DC | PRN
  Administered 2024-08-16: 50 ug via INTRAVENOUS

## 2024-08-16 MED ORDER — PHENYLEPHRINE 80 MCG/ML (10ML) SYRINGE FOR IV PUSH (FOR BLOOD PRESSURE SUPPORT)
PREFILLED_SYRINGE | INTRAVENOUS | Status: DC | PRN
  Administered 2024-08-16: 160 ug via INTRAVENOUS

## 2024-08-16 MED ORDER — ONDANSETRON HCL 4 MG/2ML IJ SOLN
INTRAMUSCULAR | Status: AC
  Filled 2024-08-16: qty 2

## 2024-08-16 MED ORDER — HYDROCODONE-ACETAMINOPHEN 7.5-325 MG/15ML PO SOLN: 10.0000 mL | Freq: Four times a day (QID) | ORAL | 0 refills | Status: DC | PRN

## 2024-08-16 SURGICAL SUPPLY — 49 items
BAG COUNTER SPONGE SURGICOUNT (BAG) ×2 IMPLANT
BAR FIX PREFORMED OMNIMAX (Miscellaneous) IMPLANT
BIT DRILL 1.6X115MM (BIT) IMPLANT
BLADE SURG 10 STRL SS (BLADE) IMPLANT
BLADE SURG 15 STRL LF DISP TIS (BLADE) IMPLANT
CANISTER SUCTION 3000ML PPV (SUCTIONS) ×2 IMPLANT
CLEANER TIP ELECTROSURG 2X2 (MISCELLANEOUS) ×2 IMPLANT
COVER SURGICAL LIGHT HANDLE (MISCELLANEOUS) ×4 IMPLANT
DRAPE HALF SHEET 40X57 (DRAPES) IMPLANT
DRIVER SURG ZDRIVE HIGH TORQUE (ORTHOPEDIC DISPOSABLE SUPPLIES) IMPLANT
ELECT COATED BLADE 2.86 ST (ELECTRODE) IMPLANT
ELECT NDL BLADE 2-5/6 (NEEDLE) IMPLANT
ELECT NEEDLE BLADE 2-5/6 (NEEDLE) IMPLANT
ELECTRODE REM PT RTRN 9FT ADLT (ELECTROSURGICAL) ×2 IMPLANT
GLOVE ECLIPSE 7.5 STRL STRAW (GLOVE) ×2 IMPLANT
GOWN STRL REUS W/ TWL LRG LVL3 (GOWN DISPOSABLE) ×4 IMPLANT
KIT BASIN OR (CUSTOM PROCEDURE TRAY) ×2 IMPLANT
KIT TURNOVER KIT B (KITS) ×2 IMPLANT
NDL HYPO 25GX1X1/2 BEV (NEEDLE) IMPLANT
NEEDLE HYPO 25GX1X1/2 BEV (NEEDLE) IMPLANT
NS IRRIG 1000ML POUR BTL (IV SOLUTION) ×2 IMPLANT
PAD ARMBOARD POSITIONER FOAM (MISCELLANEOUS) ×4 IMPLANT
PATTIES SURGICAL .5 X3 (DISPOSABLE) IMPLANT
PENCIL FOOT CONTROL (ELECTRODE) ×2 IMPLANT
PLATE 4H REG STRAIGHT 1.5 (Plate) IMPLANT
PLATE 4H W/GAP 2.0 (Plate) IMPLANT
PLATE LOCK STRT 4H 1/SCREW (Plate) IMPLANT
POSITIONER HEAD DONUT 9IN (MISCELLANEOUS) IMPLANT
PROTECTOR CORNEAL (OPHTHALMIC RELATED) IMPLANT
SCISSORS WIRE ANG 4 3/4 DISP (INSTRUMENTS) IMPLANT
SCREW BONE 2X7 CROSS DRIVE (Screw) IMPLANT
SCREW BONE MANDIB SD 2X9 (Screw) IMPLANT
SCREW HT SD X-DR 1.5X5 (Screw) IMPLANT
SCREW LOCKING X-DR 2.0X14 (Screw) IMPLANT
SCREW NLOCK LORENZ 2X5 (Screw) IMPLANT
SCREW NON LOCK X-DR 2.0X14 (Screw) IMPLANT
SCREW SELF DRILLING 2X7 (Screw) IMPLANT
SPIKE FLUID TRANSFER (MISCELLANEOUS) ×2 IMPLANT
SUT CHROMIC 3 0 PS 2 (SUTURE) ×2 IMPLANT
SUT CHROMIC 3 0 SH 27 (SUTURE) IMPLANT
SUT MNCRL AB 4-0 PS2 18 (SUTURE) ×2 IMPLANT
SUT NYLON ETHILON 5-0 P-3 1X18 (SUTURE) ×2 IMPLANT
SUT SILK 2 0 PERMA HAND 18 BK (SUTURE) IMPLANT
SUT STEEL 0 18XMFL TIE 17 (SUTURE) IMPLANT
SUT STEEL 4 (SUTURE) ×2 IMPLANT
TOWEL GREEN STERILE FF (TOWEL DISPOSABLE) ×2 IMPLANT
TRAY ENT MC OR (CUSTOM PROCEDURE TRAY) ×2 IMPLANT
WATER STERILE IRR 1000ML POUR (IV SOLUTION) ×2 IMPLANT
WIRE 24 GAUGE OMINIMAX MMF (WIRE) IMPLANT

## 2024-08-16 NOTE — ED Provider Notes (Signed)
 Patient seen on arrival to our facility.  I discussed arrival, transfer with critical care transport team.  Patient was hemodynamically stable in transport received morphine  2 mg, currently has no pain.  ENT paged.   Tracie Charleston, MD 08/16/24 [phone]

## 2024-08-16 NOTE — Anesthesia Postprocedure Evaluation (Signed)
 Anesthesia Post Note  Patient: Versa Craton  Procedure(s) Performed: OPEN REDUCTION INTERNAL FIXATION (ORIF) MANDIBULAR FRACTURE     Patient location during evaluation: PACU Anesthesia Type: General Level of consciousness: awake and alert Pain management: pain level controlled Vital Signs Assessment: post-procedure vital signs reviewed and stable Respiratory status: spontaneous breathing, nonlabored ventilation, respiratory function stable and patient connected to nasal cannula oxygen Cardiovascular status: blood pressure returned to baseline and stable Postop Assessment: no apparent nausea or vomiting Anesthetic complications: no   No notable events documented.  Last Vitals:  Vitals:   08/16/24 2015 08/16/24 2030  BP: (!) 141/73 132/76  Pulse: 87 87  Resp: 14 14  Temp:  36.7 C  SpO2: 97% 96%    Last Pain:  Vitals:   08/16/24 2030  TempSrc:   PainSc: 2                  Epifanio Lamar BRAVO

## 2024-08-16 NOTE — ED Triage Notes (Addendum)
 To ER from home via EMS for report of syncopal episode while assisting husband to bathroom. Struck chin on chair in bathroom obtaining a laceration. Bandage in place.  Patient has history of syncope due to anemia.

## 2024-08-16 NOTE — Anesthesia Procedure Notes (Signed)
 Procedure Name: Intubation Date/Time: 08/16/2024 5:36 PM  Performed by: Roddie Grate, CRNAPre-anesthesia Checklist: Patient identified, Emergency Drugs available, Suction available, Patient being monitored and Timeout performed Patient Re-evaluated:Patient Re-evaluated prior to induction Oxygen Delivery Method: Circle system utilized Preoxygenation: Pre-oxygenation with 100% oxygen Induction Type: IV induction Ventilation: Mask ventilation without difficulty and Nasal airway inserted- appropriate to patient size Laryngoscope Size: Mac and 3 Grade View: Grade II Nasal Tubes: Nasal Rae, Nasal prep performed and Magill forceps - small, utilized Tube size: 7.0 mm Number of attempts: 1 Placement Confirmation: ETT inserted through vocal cords under direct vision, positive ETCO2 and breath sounds checked- equal and bilateral Secured at: 23 cm Tube secured with: Tape Dental Injury: Teeth and Oropharynx as per pre-operative assessment  Comments: Smooth IV Induction. Eyes taped. Easy mask w/ nasal airway. DL x 1 with grade 2a view. Atraumatically placed, teeth and lip remain intact as pre-op. Secured with tape. Bilateral breath sounds +/=, EtCO2 +, Adequate TV, VSS.

## 2024-08-16 NOTE — Transfer of Care (Signed)
 Immediate Anesthesia Transfer of Care Note  Patient: Tracie Barnes  Procedure(s) Performed: OPEN REDUCTION INTERNAL FIXATION (ORIF) MANDIBULAR FRACTURE  Patient Location: PACU  Anesthesia Type:General  Level of Consciousness: drowsy  Airway & Oxygen Therapy: Patient Spontanous Breathing  Post-op Assessment: Report given to RN and Post -op Vital signs reviewed and stable  Post vital signs: Reviewed and stable  Last Vitals:  Vitals Value Taken Time  BP 139/71 08/16/24 19:02  Temp    Pulse 82 08/16/24 19:05  Resp 12 08/16/24 19:05  SpO2 95 % 08/16/24 19:05  Vitals shown include unfiled device data.  Last Pain:  Vitals:   08/16/24 1533  TempSrc:   PainSc: 3          Complications: No notable events documented.

## 2024-08-16 NOTE — ED Provider Notes (Signed)
 [facility] Provider Note    Event Date/Time   First MD Initiated Contact with Patient 08/16/24 [phone]     (approximate)   History   Fall, Laceration (Chin), and Loss of Consciousness  To ER from home via EMS for report of syncopal episode while assisting husband to bathroom. Struck chin on chair in bathroom obtaining a laceration. Bandage in place.  Patient has history of syncope due to anemia.   HPI Tracie Barnes is a 73 y.o. female PMH fibromyalgia, asthma, hypertension, hyperlipidemia, lupus presents for evaluation after loss of consciousness episode - Patient had a reported syncopal episode when getting up in the middle of the night around 4 AM to help her husband go to the bathroom.  Says she has had a history of similar prior syncopal episodes several years ago under the same circumstances. -Please she was only unconscious for a few minutes.  Did hit her head on the ground while falling, no she has a cut below her chin.  No extremity pain at this time. -Did feel somewhat fatigued yesterday but has otherwise been in her usual state of health.  Possible urinary frequency recently.  No other clear infectious symptoms. -No preceding chest pain or shortness of breath     Physical Exam   Triage Vital Signs: ED Triage Vitals  Encounter Vitals Group     BP 08/16/24 0550 124/72     Girls Systolic BP Percentile --      Girls Diastolic BP Percentile --      Boys Systolic BP Percentile --      Boys Diastolic BP Percentile --      Pulse Rate 08/16/24 0550 69     Resp 08/16/24 0550 16     Temp 08/16/24 0550 98.2 F (36.8 C)     Temp Source 08/16/24 0550 Oral     SpO2 08/16/24 0550 100 %     Weight 08/16/24 0545 146 lb 9.7 oz (66.5 kg)     Height 08/16/24 0545 5' 4 (1.626 m)     Head Circumference --      Peak Flow --      Pain Score 08/16/24 0545 5     Pain Loc --      Pain Education --      Exclude from Growth Chart --     Most recent vital  signs: Vitals:   08/16/24 0550  BP: 124/72  Pulse: 69  Resp: 16  Temp: 98.2 F (36.8 C)  SpO2: 100%     General: Awake, no distress.  HEENT: 5cm laceration just below chin, hemostatic, no foreign bodies, no apparent muscle injury.  Head otherwise atraumatic.  No midline neck pain. CV:  Good peripheral perfusion. RRR, RP 2+ Resp:  Normal effort. CTAB Abd:  No distention. Nontender to deep palpation throughout Back:  No midline ttp Other:  Full range of motion of all joints, no tenderness to palpation throughout.   ED Results / Procedures / Treatments   Labs (all labs ordered are listed, but only abnormal results are displayed) Labs Reviewed  BASIC METABOLIC PANEL WITH GFR - Abnormal; Notable for the following components:      Result Value   Potassium 3.3 (*)    Glucose, Bld 132 (*)    Creatinine, Ser 1.25 (*)    Calcium 8.5 (*)    GFR, Estimated 45 (*)    All other components within normal limits  CBC WITH DIFFERENTIAL/PLATELET  PROTIME-INR  URINALYSIS,  COMPLETE (UACMP) WITH MICROSCOPIC  TYPE AND SCREEN  TROPONIN I (HIGH SENSITIVITY)  TROPONIN I (HIGH SENSITIVITY)     EKG  Ecg = sinus rhythm, rate 70, no ST ovation depression, no significant repolarization normality, normal axis, normal intervals.  No evidence of ischemia nor arrhythmia on my interpretation.   RADIOLOGY Pending    PROCEDURES:  Critical Care performed: No  .Laceration Repair  Date/Time: 08/16/2024 7:04 AM  Performed by: Clarine Ozell LABOR, MD Authorized by: Clarine Ozell LABOR, MD   Consent:    Consent obtained:  Verbal   Consent given by:  Patient and parent   Risks, benefits, and alternatives were discussed: yes     Risks discussed:  Infection, pain, retained foreign body, need for additional repair, nerve damage, poor wound healing, tendon damage, vascular damage and poor cosmetic result   Alternatives discussed:  Delayed treatment and no treatment Universal protocol:    Procedure  explained and questions answered to patient or proxy's satisfaction: no     Relevant documents present and verified: yes     Immediately prior to procedure, a time out was called: yes     Patient identity confirmed:  Verbally with patient and arm band Anesthesia:    Anesthesia method:  Local infiltration   Local anesthetic:  Lidocaine  2% WITH epi Laceration details:    Location: chin.   Length (cm):  5 Pre-procedure details:    Preparation:  Patient was prepped and draped in usual sterile fashion Exploration:    Limited defect created (wound extended): no     Hemostasis achieved with:  Direct pressure   Wound exploration: wound explored through full range of motion and entire depth of wound visualized     Wound extent: areolar tissue not violated, fascia not violated, no foreign body, no signs of injury, no nerve damage, no tendon damage, no underlying fracture and no vascular damage     Contaminated: no   Treatment:    Area cleansed with:  Saline   Amount of cleaning:  Extensive   Irrigation solution:  Sterile saline   Irrigation volume:  500   Irrigation method:  Pressure wash   Debridement:  None   Undermining:  None   Scar revision: no   Skin repair:    Repair method:  Sutures   Suture size:  6-0   Suture material:  Fast-absorbing gut   Suture technique:  Simple interrupted   Number of sutures:  10 Approximation:    Approximation:  Close Repair type:    Repair type:  Simple Post-procedure details:    Dressing:  Open (no dressing)   Procedure completion:  Tolerated    MEDICATIONS ORDERED IN ED: Medications  lidocaine -EPINEPHrine  (XYLOCAINE  W/EPI) 2 %-1:200000 (PF) injection 20 mL (20 mLs Intradermal Given 08/16/24 0637)  acetaminophen  (TYLENOL ) tablet 1,000 mg (1,000 mg Oral Given 08/16/24 0724)  Tdap (BOOSTRIX) injection 0.5 mL (0.5 mLs Intramuscular Given 08/16/24 0739)     IMPRESSION / MDM / ASSESSMENT AND PLAN / ED COURSE  I reviewed the triage vital signs and  the nursing notes.                              DDX/MDM/AP: Differential diagnosis includes, but is not limited to, facial fracture, skull fracture, intracranial hemorrhage, C-spine injury.  No evidence of other traumatic injuries at this time.  With regard to her syncopal episode, consider orthostasis, vasovagal episode, transient arrhythmia, underlying electrolyte abnormality  or anemia.  Doubt ACS.  Consider underlying UTI.  Plan: - Labs - Tdap - CT head, face, C-spine - Laceration repair - EKG - tylenol  - Reassess  Patient's presentation is most consistent with acute presentation with potential threat to life or bodily function.  The patient is on the cardiac monitor to evaluate for evidence of arrhythmia and/or significant heart rate changes.  ED course below.  Initial laboratory workup unremarkable, no significant anemia, no leukocytosis, creatinine at baseline.  Laceration repaired with 10 absorbable sutures.  Signed out to oncoming ED provider pending imaging results and reevaluation.  Initial discussions, patient prefers discharge home if her workup is otherwise reassuring.  Clinical Course as of 08/16/24 9250  [place] Aug 16, 2024  0710 Signout by Dr. Clarine: Syncope during evening, some preceeding fatigue. Follow up bmp, trop, ct head and neck. History prior syncope with similar type scenarios.  [MQ]    Clinical Course User Index [MQ] Dicky Anes, MD     FINAL CLINICAL IMPRESSION(S) / ED DIAGNOSES   Final diagnoses:  Syncope, unspecified syncope type  Facial laceration, initial encounter     Rx / DC Orders   ED Discharge Orders     None        Note:  This document was prepared using Dragon voice recognition software and may include unintentional dictation errors.   Clarine Ozell LABOR, MD 08/16/24 [phone]

## 2024-08-16 NOTE — ED Notes (Signed)
[facility] arrival for transport.

## 2024-08-16 NOTE — ED Notes (Signed)
 Report given to [facility] - PACU. Pt changed into gown with belongings at bedside. Belongings noted - iPhone, gold ring, clothing.

## 2024-08-16 NOTE — Op Note (Signed)
 Pre-Op/postop diagnosis: Left mandibular fracture with alveolar fracture Procedure: Open reduction internal fixation of alveolar and mandibular fracture with maxillary mandibular fixation Anesthesia: General Estimated blood loss approximately 50 cc Indications: 73 year old with a fall and hit her chin with a mandible fracture that appeared to be on the left side just right medial to the mental nerve.  She was informed risk and benefits of the procedure and options were discussed all questions were answered and consent was obtained.  She had a class II occlusion in the emergency room that she felt like was correct. Procedure: Patient was taken to the operating room placed in the supine position after general nasal endotracheal tube anesthesia was placed in the supine position draped in usual sterile manner.  The left gingivolabial sulcus was injected 1% lidocaine  with 1 100,000 epinephrine .  There was already a laceration inside the mouth and the gingivolabial sulcus area.  An incision was extended to identify the fracture line.  Arch bars were placed on the top with #9 screws 2 on each side and then an arch bar just on the right side and 1 on the left.  Once this was accomplished the teeth were placed into the occlusion that appeared correct.  It was a class II occlusion.  The molars look like they lined up.  The wire was placed and secured the occlusion.  Once the fracture line was fully exposed it was noted that she had an alveolar fracture line as well.  It extended across the entire incisors.  These were positioned in what appeared to be the anatomic position of the fracture line and a mini 4-hole plate was used to secure this into position.  Monocortical screws were used.  A banding plate with 5 mm screws were used to plate the fracture line superior to the ventral nerve exit.  The mental nerve was clearly identified and preserved.  A dissection was carried out underneath the nerve and a 4-hole mandibular  plate was placed after using the template.  It the plate was bent and the plate was positioned over the fracture line with 2 holes on each side.  Holes were drilled and #14 screws were placed in each site.  The plates secured the fracture and excellent position and secure.  The wound was irrigated.  The arch bar on the left side was then secured with an additional 7 screw.  Wires were then positioned again and secured the occlusion with 3 wires 1 on each side and 1 in the center portion.  It appears the occlusion was correct.  The hypopharynx esophagus stomach was suctioned the NG tube.  The wound was closed with a running 3-0 chromic.  The patient was awakened brought to recovery in stable condition counts correct

## 2024-08-16 NOTE — Discharge Instructions (Signed)
 Your evaluation in the emergency department was overall reassuring.  Your laceration was closed with 10 absorbable sutures, and these do not need to be removed.  Please do follow-up with your primary care provider for reevaluation, and return to the emergency department with any new or worsening symptoms.  You can use Tylenol  as needed for any ongoing discomfort.

## 2024-08-16 NOTE — Consult Note (Signed)
 Reason for Consult:mandible fracture Referring Physician: Dr Garrick Jacob Kailani Brass is an 73 y.o. female.  HPI: hx of fall and broken mandible in the anterior aspect on the right. She feels like her occlusion is off right incisor hitting the upper teeth first. She has no airway issues. The floor of mouth has ecchymosis. No swelling. No nasal obstruction. No vision changes  Past Medical History:  Diagnosis Date   Allergy    Asthma    Eczema    Fluid retention    Hyperlipidemia    Hypertension    Systemic lupus erythematosus (HCC)    Unspecified vitamin D deficiency    Urticaria     Past Surgical History:  Procedure Laterality Date   COLONOSCOPY     COLONOSCOPY WITH PROPOFOL  N/A 06/30/2019   Procedure: COLONOSCOPY WITH PROPOFOL ;  Surgeon: Therisa Bi, MD;  Location: [facility] ENDOSCOPY;  Service: Gastroenterology;  Laterality: N/A;   ESOPHAGOGASTRODUODENOSCOPY      Family History  Problem Relation Age of Onset   Hypertension Father     Social History:  reports that she has never smoked. She has never used smokeless tobacco. She reports that she does not drink alcohol and does not use drugs.  Allergies:  Allergies  Allergen Reactions   Ampicillin Other (See Comments)    Pt had blisters all over her body Has patient had a PCN reaction causing immediate rash, facial/tongue/throat swelling, SOB or lightheadedness with hypotension: no Has patient had a PCN reaction causing severe rash involving mucus membranes or skin necrosis: yes Has patient had a PCN reaction that required hospitalization: no Has patient had a PCN reaction occurring within the last 10 years: no If all of the above answers are NO, then may proceed with Cephalosporin use. Pt had blisters all over her body Has patient had a PCN reaction causing immediate rash, facial/tongue/throat swelling, SOB or lightheadedness with hypotension: no Has patient had a PCN reaction causing severe rash involving mucus membranes  or skin necrosis: yes Has patient had a PCN reaction that required hospitalization: no Has patient had a PCN reaction occurring within the last 10 years: no If all of the above answers are NO, then may proceed with Cephalosporin use.   Methocarbamol Other (See Comments)   Prednisone Other (See Comments)   Sulfa Antibiotics Other (See Comments) and Hives   Minocycline Rash    Medications: I have reviewed the patient's current medications.  Results for orders placed or performed during the hospital encounter of 08/16/24 (from the past 48 hours)  CBC with Differential     Status: None   Collection Time: 08/16/24  6:38 AM  Result Value Ref Range   WBC 6.1 4.0 - 10.5 K/uL   RBC 4.01 3.87 - 5.11 MIL/uL   Hemoglobin 13.1 12.0 - 15.0 g/dL   HCT 61.6 63.9 - 53.9 %   MCV 95.5 80.0 - 100.0 fL   MCH 32.7 26.0 - 34.0 pg   MCHC 34.2 30.0 - 36.0 g/dL   RDW 87.2 88.4 - 84.4 %   Platelets 200 150 - 400 K/uL   nRBC 0.0 0.0 - 0.2 %   Neutrophils Relative % 63 %   Neutro Abs 3.9 1.7 - 7.7 K/uL   Lymphocytes Relative 27 %   Lymphs Abs 1.6 0.7 - 4.0 K/uL   Monocytes Relative 9 %   Monocytes Absolute 0.5 0.1 - 1.0 K/uL   Eosinophils Relative 1 %   Eosinophils Absolute 0.0 0.0 - 0.5 K/uL  Basophils Relative 0 %   Basophils Absolute 0.0 0.0 - 0.1 K/uL   Immature Granulocytes 0 %   Abs Immature Granulocytes 0.01 0.00 - 0.07 K/uL    Comment: Performed at [facility], [address] [place], [place] [address]  Protime-INR     Status: None   Collection Time: 08/16/24  6:38 AM  Result Value Ref Range   Prothrombin Time 15.1 11.4 - 15.2 seconds   INR 1.1 0.8 - 1.2    Comment: (NOTE) INR goal varies based on device and disease states. Performed at [facility], [address] [place], [place] [address]   Type and screen [facility]     Status: None (Preliminary result)   Collection Time: 08/16/24  6:38 AM  Result Value Ref Range   ABO/RH(D) A POS     Antibody Screen POS    Sample Expiration      08/19/2024,2359 Performed at [facility], [address] [place], [place] [address]    Antibody Identification PENDING   Basic metabolic panel     Status: Abnormal   Collection Time: 08/16/24  6:38 AM  Result Value Ref Range   Sodium 138 135 - 145 mmol/L   Potassium 3.3 (L) 3.5 - 5.1 mmol/L   Chloride 101 98 - 111 mmol/L   CO2 30 22 - 32 mmol/L   Glucose, Bld 132 (H) 70 - 99 mg/dL    Comment: Glucose reference range applies only to samples taken after fasting for at least 8 hours.   BUN 15 8 - 23 mg/dL   Creatinine, Ser 8.74 (H) 0.44 - 1.00 mg/dL   Calcium 8.5 (L) 8.9 - 10.3 mg/dL   GFR, Estimated 45 (L) >60 mL/min    Comment: (NOTE) Calculated using the CKD-EPI Creatinine Equation (2021)    Anion gap 7 5 - 15    Comment: Performed at [facility], [address] [place], [place] [address]  Troponin I (High Sensitivity)     Status: None   Collection Time: 08/16/24  6:38 AM  Result Value Ref Range   Troponin I (High Sensitivity) 4 <18 ng/L    Comment: (NOTE) Elevated high sensitivity troponin I (hsTnI) values and significant  changes across serial measurements may suggest ACS but many other  chronic and acute conditions are known to elevate hsTnI results.  Refer to the Links section for chest pain algorithms and additional  guidance. Performed at [facility], [address] [place], [place] [address]   Urinalysis, Complete w Microscopic -Urine, Clean Catch     Status: Abnormal   Collection Time: 08/16/24  7:34 AM  Result Value Ref Range   Color, Urine YELLOW (A) YELLOW   APPearance CLEAR (A) CLEAR   Specific Gravity, Urine 1.014 1.005 - 1.030   pH 6.0 5.0 - 8.0   Glucose, UA NEGATIVE NEGATIVE mg/dL   Hgb urine dipstick NEGATIVE NEGATIVE   Bilirubin Urine NEGATIVE NEGATIVE   Ketones, ur NEGATIVE NEGATIVE mg/dL   Protein, ur NEGATIVE NEGATIVE mg/dL   Nitrite NEGATIVE NEGATIVE    Leukocytes,Ua NEGATIVE NEGATIVE   RBC / HPF 0-5 0 - 5 RBC/hpf   WBC, UA 6-10 0 - 5 WBC/hpf   Bacteria, UA NONE SEEN NONE SEEN   Squamous Epithelial / HPF 0-5 0 - 5 /HPF   Mucus PRESENT     Comment: Performed at [facility], [address] [place], [place] [address]  Troponin I (High Sensitivity)  Status: None   Collection Time: 08/16/24  8:46 AM  Result Value Ref Range   Troponin I (High Sensitivity) 4 <18 ng/L    Comment: (NOTE) Elevated high sensitivity troponin I (hsTnI) values and significant  changes across serial measurements may suggest ACS but many other  chronic and acute conditions are known to elevate hsTnI results.  Refer to the Links section for chest pain algorithms and additional  guidance. Performed at [facility], [address] [place], [place] [address]     CT Maxillofacial Wo Contrast Result Date: 08/16/2024 EXAM: CT Face without contrast 08/16/2024 08:28:16 AM TECHNIQUE: CT of the face was performed without the administration of intravenous contrast. Multiplanar reformatted images are provided for review. Automated exposure control, iterative reconstruction, and/or weight based adjustment of the mA/kV was utilized to reduce the radiation dose to as low as reasonably achievable. COMPARISON: None available CLINICAL HISTORY: Trauma to chin. To ER from home via EMS for report of syncopal episode while assisting husband to bathroom. Struck chin on chair in bathroom obtaining a laceration. Bandage in place. Patient has history of syncope due to anemia. FINDINGS: AERODIGESTIVE TRACT: No mass. No edema. SALIVARY GLANDS: No acute abnormality. LYMPH NODES: No suspicious cervical lymphadenopathy. SOFT TISSUES: Soft tissue laceration and hematoma is present in the anterior to the genu of the mandible. BRAIN, ORBITS AND SINUSES: Atherosclerotic calcifications are present within the cavernous internal carotid arteries bilaterally. BONES: A comminuted  nondisplaced fracture is present in the anterior mandible. A component of the fracture extends through the root of the left first premolar. Additional fractures extend anteriorly through the roots of the medial and lateral incisors as well as the left canine tooth. IMPRESSION: 1. Comminuted nondisplaced fracture of the anterior mandible with extension through the roots of the left first premolar, medial and lateral incisors, and left canine tooth. 2. Soft tissue laceration and hematoma anterior to the genu of the mandible. Electronically signed by: Lonni Necessary MD 08/16/2024 08:56 AM EDT RP Workstation: [id number]   CT Cervical Spine Wo Contrast Result Date: 08/16/2024 EXAM: CT CERVICAL SPINE WITHOUT CONTRAST 08/16/2024 08:28:16 AM TECHNIQUE: CT of the cervical spine was performed without the administration of intravenous contrast. Multiplanar reformatted images are provided for review. Automated exposure control, iterative reconstruction, and/or weight based adjustment of the mA/kV was utilized to reduce the radiation dose to as low as reasonably achievable. COMPARISON: None available. CLINICAL HISTORY: To ER from home via EMS for report of syncopal episode while assisting husband to bathroom. Struck chin on chair in bathroom obtaining a laceration. Bandage in place. Patient has history of syncope due to anemia. FINDINGS: CERVICAL SPINE: BONES AND ALIGNMENT: No acute fracture or traumatic malalignment. DEGENERATIVE CHANGES: Multilevel degenerative changes are present within the cervical spine. Moderate foraminal narrowing is present bilaterally at C4-5. Severe left foraminal narrowing is present at C5-6. SOFT TISSUES: No prevertebral soft tissue swelling. IMPRESSION: 1. No acute abnormality of the cervical spine related to the reported trauma. 2. Multilevel degenerative changes with moderate foraminal narrowing bilaterally at C4-5 and severe left foraminal narrowing at C5-6. Electronically signed by:  Lonni Necessary MD 08/16/2024 08:46 AM EDT RP Workstation: [id number]   CT Hip Left Wo Contrast Result Date: 08/16/2024 EXAM: CT OF THE LEFT HIP WITHOUT IV CONTRAST 08/16/2024 08:28:16 AM TECHNIQUE: CT of the left hip was performed without the administration of intravenous contrast. Multiplanar reformatted images are provided for review. Automated exposure control, iterative reconstruction, and/or weight based adjustment of the mA/kV was utilized to  reduce the radiation dose to as low as reasonably achievable. COMPARISON: None available. CLINICAL HISTORY: Hip trauma, fracture suspected, no prior imaging. Pain to left hip from fall. FINDINGS: BONES: No signs of acute fracture or dislocation. Sclerotic densities within the subchondral aspect of the femoral head compatible with avascular necrosis. SOFT TISSUE: No significant soft tissue edema or fluid collections. No soft tissue mass. JOINT: Mild joint space narrowing, subchondral sclerosis and marginal spur formation about the hip. INTRAPELVIC CONTENTS: Limited images of the intrapelvic contents are unremarkable. IMPRESSION: 1. No acute fracture or dislocation. 2. Avascular necrosis of the femoral head. 3. Mild joint left hip osteoarthritis. Electronically signed by: Waddell Calk MD 08/16/2024 08:45 AM EDT RP Workstation: [id number]   CT HEAD WO CONTRAST ( ) Result Date: 08/16/2024 EXAM: CT HEAD WITHOUT CONTRAST 08/16/2024 08:28:16 AM TECHNIQUE: CT of the head was performed without the administration of intravenous contrast. Automated exposure control, iterative reconstruction, and/or weight based adjustment of the mA/kV was utilized to reduce the radiation dose to as low as reasonably achievable. COMPARISON: None available. CLINICAL HISTORY: Patient with history of syncope due to anemia presented to the ER after a syncopal episode resulting in a fall and chin laceration. FINDINGS: BRAIN AND VENTRICLES: No acute hemorrhage. Gray-white differentiation is  preserved. No hydrocephalus. No extra-axial collection. No mass effect or midline shift. ORBITS: Bilateral lens replacements are noted. The globes and orbits are otherwise within normal limits. SINUSES: No acute abnormality. SOFT TISSUES AND SKULL: No acute soft tissue abnormality. No skull fracture. IMPRESSION: 1. No acute intracranial abnormality. Electronically signed by: Lonni Necessary MD 08/16/2024 08:39 AM EDT RP Workstation: [id number]    ROS Blood pressure (!) 142/71, pulse 75, temperature 98.7 F (37.1 C), temperature source Axillary, resp. rate 18, SpO2 100%. Physical Exam HENT:     Head: Normocephalic.     Comments: Laceration on the chin that is sutured shut byu ER    Right Ear: External ear normal.     Left Ear: External ear normal.     Nose: Nose normal.     Mouth/Throat:     Comments: There is a laceration in the midline gingivalabial sulcus. The right canine has some blood along its edge. She appears to have occlusion which is a class 2 which does not seem to be odd to her. The tongue is normal and FOM has ecchymosis but not swollen.  Eyes:     Extraocular Movements: Extraocular movements intact.     Pupils: Pupils are equal, round, and reactive to light.  Musculoskeletal:     Cervical back: Normal range of motion.  Neurological:     Mental Status: She is alert.       Assessment/Plan: Mandible fracture- we discussed risks, benefits and options of the ORIF of mandible fracture. All questions answered and consent obtained, I will set in OR. NPO since yesterday.   Norleen Notice 08/16/2024, 4:12 PM

## 2024-08-16 NOTE — ED Notes (Signed)
 [facility] called for transfer per Dr. Dicky , spoke with Leretha

## 2024-08-16 NOTE — ED Provider Notes (Signed)
 Clinical Course as of 08/16/24 1358  Sun Aug 16, 2024  0710 Signout by Dr. Clarine: Syncope during evening, some preceeding fatigue. Follow up bmp, trop, ct head and neck. History prior syncope with similar type scenarios.  [MQ]  1028 Transfer request initiated to [facility] for review and recommendations / possible transfer for anterior mandible fracurue. [facility] transfer center discussed case with me, images being powershared. Patient advises very comfortable with plan to be seen (if needed) today at [facility] or happy to follow-up there as well. Patient worked as a Sales executive, familiar with oral max face items to some extend herself as well.  [MQ]  1324 Dr. Joanette on call at [facility] for oral surgery.  Pager number goes to voicemail, no answer.  Left message requesting callback.   [MQ]  1329 A couple of attempts have been made to obtain recommendations from on-call oral surgeon at [facility], they have advised that they cannot accept the patient that at that it is not certain to me as to exactly why.  Nonetheless, it does not appear that [facility] oral surgery willing to discuss the case directly with me for any recommendations or follow-up plan.  I have also left a voicemail for Dr. Joanette at the listed on-call number for oral surgery at [facility].  Awaiting callback from Dr. Joanette, will call [facility] to also attempt to reach him via the consult line [MQ]  1331 Nursing secretary reaching out to [facility] to discuss mandible fracture and obtain recommendations, possible follow-up or transfer.  Images being PowerShare there.  At this juncture, trying to use an oral surgeon to have imaging reviewed and obtain recommendations.  Possible transfer not yet excluded, patient resting comfortably without distress. [MQ]  1345 Received callback from Dr. Joanette.  He advises that Dr. Norleen Notice is the appropriate covering physician for consult for this.  I have sent haiku message and I am paging Dr. Notice at this time [MQ]   1345 Patient reports pain well-controlled.  Fully awake and alert. [MQ]  1347 Spoke reviewed history and with Dr. Norleen Notice.  Dr. Notice advises transfer to [facility], ED for further assessment and potential need for surgical repair.   [MQ]  1351 Patient resting, pain well-controlled.  Patient understanding she is agreeable with plan to transfer to [facility] for consultation with facial trauma specialist/ENT Dr. Notice. [MQ]  1352 Secretary Lyndy Ada initiating transfer request to [facility] for ER to ER transfer [MQ]    Clinical Course User Index [MQ] Dicky Anes, MD     ----------------------------------------- 7:51 AM on 08/16/2024 ----------------------------------------- Patient reports soreness of the left lateral hip.  She was able to bear weight on it though.  It is not shortened it is not rotated.  She reports she suffers from chronic left hip pain in addition and has had to have steroid shots before.  She is hoping that is just bruised up after the fall.  Patient is awake alert in no acute distress.  Currently awaiting imaging results   CT Maxillofacial Wo Contrast Result Date: 08/16/2024 EXAM: CT Face without contrast 08/16/2024 08:28:16 AM TECHNIQUE: CT of the face was performed without the administration of intravenous contrast. Multiplanar reformatted images are provided for review. Automated exposure control, iterative reconstruction, and/or weight based adjustment of the mA/kV was utilized to reduce the radiation dose to as low as reasonably achievable. COMPARISON: None available CLINICAL HISTORY: Trauma to chin. To ER from home via EMS for report of syncopal  episode while assisting husband to bathroom. Struck chin on chair in bathroom obtaining a laceration. Bandage in place. Patient has history of syncope due to anemia. FINDINGS: AERODIGESTIVE TRACT: No mass. No edema. SALIVARY GLANDS: No acute abnormality. LYMPH NODES: No suspicious cervical lymphadenopathy. SOFT TISSUES:  Soft tissue laceration and hematoma is present in the anterior to the genu of the mandible. BRAIN, ORBITS AND SINUSES: Atherosclerotic calcifications are present within the cavernous internal carotid arteries bilaterally. BONES: A comminuted nondisplaced fracture is present in the anterior mandible. A component of the fracture extends through the root of the left first premolar. Additional fractures extend anteriorly through the roots of the medial and lateral incisors as well as the left canine tooth. IMPRESSION: 1. Comminuted nondisplaced fracture of the anterior mandible with extension through the roots of the left first premolar, medial and lateral incisors, and left canine tooth. 2. Soft tissue laceration and hematoma anterior to the genu of the mandible. Electronically signed by: Lonni Necessary MD 08/16/2024 08:56 AM EDT RP Workstation: [id number]   CT Cervical Spine Wo Contrast Result Date: 08/16/2024 EXAM: CT CERVICAL SPINE WITHOUT CONTRAST 08/16/2024 08:28:16 AM TECHNIQUE: CT of the cervical spine was performed without the administration of intravenous contrast. Multiplanar reformatted images are provided for review. Automated exposure control, iterative reconstruction, and/or weight based adjustment of the mA/kV was utilized to reduce the radiation dose to as low as reasonably achievable. COMPARISON: None available. CLINICAL HISTORY: To ER from home via EMS for report of syncopal episode while assisting husband to bathroom. Struck chin on chair in bathroom obtaining a laceration. Bandage in place. Patient has history of syncope due to anemia. FINDINGS: CERVICAL SPINE: BONES AND ALIGNMENT: No acute fracture or traumatic malalignment. DEGENERATIVE CHANGES: Multilevel degenerative changes are present within the cervical spine. Moderate foraminal narrowing is present bilaterally at C4-5. Severe left foraminal narrowing is present at C5-6. SOFT TISSUES: No prevertebral soft tissue swelling. IMPRESSION:  1. No acute abnormality of the cervical spine related to the reported trauma. 2. Multilevel degenerative changes with moderate foraminal narrowing bilaterally at C4-5 and severe left foraminal narrowing at C5-6. Electronically signed by: Lonni Necessary MD 08/16/2024 08:46 AM EDT RP Workstation: [id number]   CT Hip Left Wo Contrast Result Date: 08/16/2024 EXAM: CT OF THE LEFT HIP WITHOUT IV CONTRAST 08/16/2024 08:28:16 AM TECHNIQUE: CT of the left hip was performed without the administration of intravenous contrast. Multiplanar reformatted images are provided for review. Automated exposure control, iterative reconstruction, and/or weight based adjustment of the mA/kV was utilized to reduce the radiation dose to as low as reasonably achievable. COMPARISON: None available. CLINICAL HISTORY: Hip trauma, fracture suspected, no prior imaging. Pain to left hip from fall. FINDINGS: BONES: No signs of acute fracture or dislocation. Sclerotic densities within the subchondral aspect of the femoral head compatible with avascular necrosis. SOFT TISSUE: No significant soft tissue edema or fluid collections. No soft tissue mass. JOINT: Mild joint space narrowing, subchondral sclerosis and marginal spur formation about the hip. INTRAPELVIC CONTENTS: Limited images of the intrapelvic contents are unremarkable. IMPRESSION: 1. No acute fracture or dislocation. 2. Avascular necrosis of the femoral head. 3. Mild joint left hip osteoarthritis. Electronically signed by: Waddell Calk MD 08/16/2024 08:45 AM EDT RP Workstation: [id number]   CT HEAD WO CONTRAST ( ) Result Date: 08/16/2024 EXAM: CT HEAD WITHOUT CONTRAST 08/16/2024 08:28:16 AM TECHNIQUE: CT of the head was performed without the administration of intravenous contrast. Automated exposure control, iterative reconstruction, and/or weight based adjustment of the mA/kV was utilized to  reduce the radiation dose to as low as reasonably achievable. COMPARISON: None  available. CLINICAL HISTORY: Patient with history of syncope due to anemia presented to the ER after a syncopal episode resulting in a fall and chin laceration. FINDINGS: BRAIN AND VENTRICLES: No acute hemorrhage. Gray-white differentiation is preserved. No hydrocephalus. No extra-axial collection. No mass effect or midline shift. ORBITS: Bilateral lens replacements are noted. The globes and orbits are otherwise within normal limits. SINUSES: No acute abnormality. SOFT TISSUES AND SKULL: No acute soft tissue abnormality. No skull fracture. IMPRESSION: 1. No acute intracranial abnormality. Electronically signed by: Lonni Necessary MD 08/16/2024 08:39 AM EDT RP Workstation: [id number]      Dr. Vanetta [facility]) discussed case, advises discuss with OMFS. OMFS at [facility] paged.     ----------------------------------------- 1:59 PM on 08/16/2024 ----------------------------------------- Patient has been accepted to [facility] via [facility] and ER to ER transfer by Dr. Maude Galloway, MD.  Dr. Roark advising he will see in provide consult once patient arrives to the [facility], ED.  Patient is very much understanding and agreeable.  Pain well-controlled, receiving IV fluids.  Anticipate ALS transfer for need for ongoing cardiac monitoring due to proceeding syncopal episode as well.  The patient is stable for transfer to [facility] at this time   Dicky Anes, MD 08/16/24 1400

## 2024-08-16 NOTE — ED Notes (Signed)
 Second troponin and lavender tubes sent.

## 2024-08-16 NOTE — ED Notes (Signed)
 Report completed to Warren, Geophysicist/field seismologist nurse [facility].

## 2024-08-16 NOTE — ED Notes (Signed)
 EMTALA and transfer signature verified by this RN at this time.

## 2024-08-16 NOTE — ED Notes (Signed)
 CCMD notified of cardiac monitoring order.

## 2024-08-16 NOTE — ED Notes (Signed)
 Consent for transfer completed.

## 2024-08-16 NOTE — ED Notes (Signed)
 Report to Alm with [facility] transport team.

## 2024-08-16 NOTE — ED Triage Notes (Signed)
 Pt bib [facility] coming from [facility] ED. Pt reportedly had fall this morning. Pt was helping husband to bathroom, fell and hit her chin on the counter. Pt transferred to [facility] for surgical consult.

## 2024-08-16 NOTE — Anesthesia Preprocedure Evaluation (Signed)
 Anesthesia Evaluation  Patient identified by MRN, date of birth, ID band Patient awake    Reviewed: Allergy & Precautions, NPO status , Patient's Chart, lab work & pertinent test results  Airway Mallampati: II  TM Distance: >3 FB Neck ROM: Full    Dental   Pulmonary asthma    Pulmonary exam normal        Cardiovascular hypertension, Pt. on medications  Rhythm:Regular Rate:Normal     Neuro/Psych  Headaches    GI/Hepatic Neg liver ROS,GERD  ,,  Endo/Other  negative endocrine ROS    Renal/GU Renal InsufficiencyRenal disease     Musculoskeletal  (+)  Fibromyalgia -  Abdominal   Peds  Hematology negative hematology ROS (+)   Anesthesia Other Findings   Reproductive/Obstetrics                              Anesthesia Physical Anesthesia Plan  ASA: 2  Anesthesia Plan: General   Post-op Pain Management: Ofirmev  IV (intra-op)*   Induction: Intravenous  PONV Risk Score and Plan: 3 and Dexamethasone , Ondansetron  and Treatment may vary due to age or medical condition  Airway Management Planned: Nasal ETT  Additional Equipment: None  Intra-op Plan:   Post-operative Plan: Extubation in OR  Informed Consent: I have reviewed the patients History and Physical, chart, labs and discussed the procedure including the risks, benefits and alternatives for the proposed anesthesia with the patient or authorized representative who has indicated his/her understanding and acceptance.     Dental advisory given  Plan Discussed with:   Anesthesia Plan Comments:         Anesthesia Quick Evaluation

## 2024-08-19 ENCOUNTER — Encounter (HOSPITAL_COMMUNITY): Payer: Self-pay | Admitting: Otolaryngology

## 2024-08-20 LAB — TYPE AND SCREEN
ABO/RH(D): A POS
Antibody Screen: POSITIVE

## 2024-08-24 ENCOUNTER — Encounter (INDEPENDENT_AMBULATORY_CARE_PROVIDER_SITE_OTHER): Payer: Self-pay | Admitting: Physician Assistant

## 2024-08-24 ENCOUNTER — Ambulatory Visit (INDEPENDENT_AMBULATORY_CARE_PROVIDER_SITE_OTHER): Admitting: Physician Assistant

## 2024-08-24 VITALS — BP 146/78 | HR 92

## 2024-08-24 DIAGNOSIS — Z9889 Other specified postprocedural states: Secondary | ICD-10-CM

## 2024-08-24 DIAGNOSIS — S02602D Fracture of unspecified part of body of left mandible, subsequent encounter for fracture with routine healing: Secondary | ICD-10-CM

## 2024-08-24 NOTE — Progress Notes (Signed)
 Dear Dr. Nicoletta, Here is my assessment for our mutual patient, Tracie Barnes. Thank you for allowing me the opportunity to care for your patient. Please do not hesitate to contact me should you have any other questions. Sincerely, Tracie Cohen PA-C  Otolaryngology Clinic Note Referring provider: Dr. Nicoletta HPI:  Tracie Barnes is a 73 y.o. female kindly referred by Dr. Nicoletta   The patient is a 73 year old female seen in our office for follow-up evaluation status post open reduction internal fixation with MMF of mandibular fracture by Dr. Roark on 08/16/2024.  The patient suffered a fall causing mandibular fracture.  The patient was seen and evaluated by Dr. Roark and myself today.  Postoperatively she notes that her occlusion feels normal with the exception of 1 tooth on the right lower jaw.  She notes some ongoing discomfort and has been using Tylenol  and oxycodone as needed.  She notes that the wires are irritating her lips.  She has developed some swelling to the left lateral jaw approximately 5 days ago, no significant worsening, no infectious signs or symptoms.  No issues with the incision site.   Independent Review of Additional Tests or Records:  Operative note on 08/16/2024   PMH/Meds/All/SocHx/FamHx/ROS:   Past Medical History:  Diagnosis Date   Allergy    Asthma    Chronic kidney disease    Dyspnea    Eczema    Fluid retention    Hyperlipidemia    Hypertension    Pre-diabetes    Systemic lupus erythematosus (HCC)    Unspecified vitamin D deficiency    Urticaria      Past Surgical History:  Procedure Laterality Date   COLONOSCOPY     COLONOSCOPY WITH PROPOFOL  N/A 06/30/2019   Procedure: COLONOSCOPY WITH PROPOFOL ;  Surgeon: Therisa Bi, MD;  Location: [facility] ENDOSCOPY;  Service: Gastroenterology;  Laterality: N/A;   ESOPHAGOGASTRODUODENOSCOPY     ORIF MANDIBULAR FRACTURE N/A 08/16/2024   Procedure: OPEN REDUCTION INTERNAL FIXATION (ORIF) MANDIBULAR FRACTURE;  Surgeon: Roark Rush, MD;  Location: [facility] OR;  Service: ENT;  Laterality: N/A;  AND MMF SCREWS    Family History  Problem Relation Age of Onset   Hypertension Father      Social Connections: Not on file      Current Outpatient Medications:    ACIDOPHILUS LACTOBACILLUS PO, Take 2 tablets by mouth daily., Disp: , Rfl:    albuterol  (PROVENTIL ) (2.5 MG/3ML) 0.083% nebulizer solution, Take 3 mLs (2.5 mg total) by nebulization every 4 (four) hours as needed for wheezing or shortness of breath., Disp: 75 mL, Rfl: 1   albuterol  (VENTOLIN  HFA) 108 (90 Base) MCG/ACT inhaler, Inhale 2 puffs into the lungs every 4 (four) hours as needed for wheezing or shortness of breath., Disp: 1 each, Rfl: 5   azelastine  (ASTELIN ) 0.1 % nasal spray, 1 spray per nostril 1-2 times daily as needed., Disp: 30 mL, Rfl: 5   butalbital -acetaminophen -caffeine  (FIORICET) 50-325-40 MG tablet, Take 1 tablet by mouth every 6 (six) hours as needed for headache., Disp: 12 tablet, Rfl: 5   cetirizine  (ZYRTEC ) 10 MG tablet, Take 1 tablet (10 mg total) by mouth daily., Disp: 30 tablet, Rfl: 5   Cholecalciferol 10000 units TABS, Take 1,000 Units by mouth daily., Disp: , Rfl:    clindamycin  (CLEOCIN ) 300 MG capsule, Take 1 capsule (300 mg total) by mouth 3 (three) times daily for 10 days., Disp: 30 capsule, Rfl: 0   clobetasol (TEMOVATE) 0.05 % external solution, APPLY SMALL AMOUNT TO AFFECTED  AREA DAILY AS NEEDED - RINSE IN SCALP. NEVER TO FACE., Disp: , Rfl:    dexlansoprazole  (DEXILANT ) 60 MG capsule, Take 1 capsule (60 mg total) by mouth daily., Disp: 90 capsule, Rfl: 1   dicyclomine (BENTYL) 10 MG capsule, Take 10 mg by mouth 4 (four) times daily -  before meals and at bedtime., Disp: , Rfl:    docusate sodium (COLACE) 100 MG capsule, Take 100 mg by mouth 2 (two) times daily., Disp: , Rfl:    fluocinonide (LIDEX) 0.05 % external solution, APPLY SMALL AMOUNT TO AFFECTED AREA DAILY AS NEEDED, Disp: , Rfl:    fluticasone  (FLONASE ) 50 MCG/ACT nasal  spray, Place 1 spray into both nostrils daily., Disp: 16 g, Rfl: 5   fluticasone -salmeterol (ADVAIR  HFA) 115-21 MCG/ACT inhaler, Inhale 2 puffs into the lungs 2 (two) times daily. (Patient not taking: Reported on 01/21/2024), Disp: 1 each, Rfl: 12   fluticasone -salmeterol (WIXELA INHUB) 250-50 MCG/ACT AEPB, Inhale 1 puff into the lungs in the morning and at bedtime. (Patient not taking: Reported on 01/21/2024), Disp: 60 each, Rfl: 5   hydroxychloroquine (PLAQUENIL) 200 MG tablet, Take by mouth daily., Disp: , Rfl:    hydrOXYzine  (VISTARIL ) 25 MG capsule, Take 1-2 tablets nightly, Disp: 60 capsule, Rfl: 5   IPRATROPIUM BROMIDE NA, Place 1 spray into the nose in the morning and at bedtime., Disp: , Rfl:    ipratropium-albuterol  (DUONEB) 0.5-2.5 (3) MG/3ML SOLN, Take 3 mLs by nebulization every 4 (four) hours as needed., Disp: 360 mL, Rfl: 3   ketotifen (ZADITOR) 0.025 % ophthalmic solution, INSTILL 1 DROP IN BOTH EYES TWICE A DAY AS NEEDED, Disp: , Rfl:    methylPREDNISolone  (MEDROL  DOSEPAK) 4 MG TBPK tablet, Take 6 pills on day one then decrease by 1 pill each day (Patient not taking: Reported on 01/21/2024), Disp: 21 tablet, Rfl: 0   metoprolol  tartrate (LOPRESSOR ) 25 MG tablet, Take 0.5 tablets (12.5 mg total) by mouth daily., Disp: 45 tablet, Rfl: 1   montelukast  (SINGULAIR ) 10 MG tablet, Take 1 tablet (10 mg total) by mouth at bedtime., Disp: 30 tablet, Rfl: 5   mycophenolate (CELLCEPT) 500 MG tablet, Take 500 mg by mouth 2 (two) times daily., Disp: , Rfl:    Nebulizer System All-In-One MISC, 1 Device by Does not apply route as needed., Disp: 1 each, Rfl: 0   nortriptyline  (PAMELOR ) 25 MG capsule, 1 tab in am, 2 tabs at HS, Disp: 270 capsule, Rfl: 1   Omega-3 Fatty Acids (FISH OIL) 1000 MG CAPS, Take 1 capsule by mouth daily., Disp: , Rfl:    pantoprazole (PROTONIX) 40 MG tablet, Take 40 mg by mouth 2 (two) times daily., Disp: , Rfl:    Respiratory Therapy Supplies (NEBULIZER/TUBING/MOUTHPIECE) KIT,  Use as directed., Disp: 1 kit, Rfl: 0   triamterene -hydrochlorothiazide (MAXZIDE-25) 37.5-25 MG tablet, Take 0.5 tablets by mouth daily., Disp: 45 tablet, Rfl: 1   vitamin E 400 UNIT capsule, Take 800 Units by mouth daily. , Disp: , Rfl:    Physical Exam:   There were no vitals taken for this visit.  Pertinent Findings  CN II-XII intact MMF wires in place, brackets sturdy with no loose screws, mucosal incision intact, no visible plates, firm swelling noted along the left external lateral mandible, no fluctuance, no overlying redness, submandibular incision clean dry and intact with Prolene stitches in place, no surrounding infection No respiratory distress or stridor  Seprately Identifiable Procedures:  None  Impression & Plans:  Charleen Madera is a 73  y.o. female with the following   1 week status post open reduction internal fixation with MMF of mandibular fracture-  Patient overall doing well.  Dr. Roark had the opportunity to personally evaluate the patient here today.  Good occlusion.  No signs of infection.  She does have some swelling along the left jaw, low suspicion for infection.  Dr. Roark would recommend continuing the clindamycin  that was prescribed.  Also would recommend using ibuprofen.  We did remove the middle wire as this was causing significant discomfort.  Dr. Roark had a lengthy discussion with the patient on timing of removing remainder wires.  Given she has several sturdy plates it would not be unreasonable to remove this, although she is having some swelling along the left mandible at the site of the fracture.  We will maintain arch bars at this time, she will follow-up in 1 week.  She will follow-up sooner as needed.  Sutures are removed from the submandibular incision.  The patient verbalized understanding and agreement to today's plan and had no further questions or concerns.     - f/u 1 week   Thank you for allowing me the opportunity to care for your patient.  Please do not hesitate to contact me should you have any other questions.  Sincerely, Tracie Cohen PA-C [facility] Phone: [phone] Fax: [phone]  08/24/2024, 9:55 AM

## 2024-08-27 NOTE — Telephone Encounter (Signed)
 [facility] Referral has been added to chart

## 2024-09-01 ENCOUNTER — Ambulatory Visit (INDEPENDENT_AMBULATORY_CARE_PROVIDER_SITE_OTHER): Admitting: Physician Assistant

## 2024-09-01 ENCOUNTER — Encounter (INDEPENDENT_AMBULATORY_CARE_PROVIDER_SITE_OTHER): Payer: Self-pay | Admitting: Physician Assistant

## 2024-09-01 VITALS — BP 112/71 | HR 94

## 2024-09-01 DIAGNOSIS — Z9889 Other specified postprocedural states: Secondary | ICD-10-CM

## 2024-09-01 DIAGNOSIS — S02602D Fracture of unspecified part of body of left mandible, subsequent encounter for fracture with routine healing: Secondary | ICD-10-CM

## 2024-09-01 MED ORDER — CHLORHEXIDINE GLUCONATE 0.12 % MT SOLN: 15.0000 mL | Freq: Two times a day (BID) | OROMUCOSAL | 0 refills | Status: AC

## 2024-09-01 MED ORDER — CLINDAMYCIN HCL 300 MG PO CAPS: 300.0000 mg | ORAL_CAPSULE | Freq: Three times a day (TID) | ORAL | 0 refills | Status: DC

## 2024-09-01 NOTE — Progress Notes (Signed)
 Dear Dr. Nicoletta, Here is my assessment for our mutual patient, Tracie Barnes. Thank you for allowing me the opportunity to care for your patient. Please do not hesitate to contact me should you have any other questions. Sincerely, Chyrl Cohen PA-C  Otolaryngology Clinic Note Referring provider: Dr. Nicoletta HPI:  Tracie Barnes is a 73 y.o. female kindly referred by Dr. Nicoletta   The patient is a 73 year old female seen in our office for follow-up evaluation status post open reduction internal fixation with MMF of mandibular fracture by Dr. Roark on 08/16/2024.  The patient suffered a fall causing mandibular fracture. The patient was last seen in the office on 08/24/2024, below is a recap of that visit.  Postoperatively she notes that her occlusion feels normal with the exception of 1 tooth on the right lower jaw. She notes some ongoing discomfort and has been using Tylenol  and oxycodone as needed. She notes that the wires are irritating her lips. She has developed some swelling to the left lateral jaw approximately 5 days ago, no significant worsening, no infectious signs or symptoms. No issues with the incision site.   Update 09/01/2024-  Since her last office visit she notes that she has been doing well, pain improved. She notes the swelling along the left jaw has gotten better. She denies any fever, or drainage. She has been able to maintain adequate hydration.    Independent Review of Additional Tests or Records:   none   PMH/Meds/All/SocHx/FamHx/ROS:   Past Medical History:  Diagnosis Date   Allergy    Asthma    Chronic kidney disease    Dyspnea    Eczema    Fluid retention    Hyperlipidemia    Hypertension    Pre-diabetes    Systemic lupus erythematosus (HCC)    Unspecified vitamin D deficiency    Urticaria      Past Surgical History:  Procedure Laterality Date   COLONOSCOPY     COLONOSCOPY WITH PROPOFOL  N/A 06/30/2019   Procedure: COLONOSCOPY WITH PROPOFOL ;  Surgeon: Therisa Bi, MD;  Location: [facility] ENDOSCOPY;  Service: Gastroenterology;  Laterality: N/A;   ESOPHAGOGASTRODUODENOSCOPY     ORIF MANDIBULAR FRACTURE N/A 08/16/2024   Procedure: OPEN REDUCTION INTERNAL FIXATION (ORIF) MANDIBULAR FRACTURE;  Surgeon: Roark Rush, MD;  Location: [facility] OR;  Service: ENT;  Laterality: N/A;  AND MMF SCREWS    Family History  Problem Relation Age of Onset   Hypertension Father      Social Connections: Not on file      Current Outpatient Medications:    clindamycin  (CLEOCIN ) 300 MG capsule, Take 1 capsule (300 mg total) by mouth 3 (three) times daily., Disp: 21 capsule, Rfl: 0   ACIDOPHILUS LACTOBACILLUS PO, Take 2 tablets by mouth daily., Disp: , Rfl:    albuterol  (PROVENTIL ) (2.5 MG/3ML) 0.083% nebulizer solution, Take 3 mLs (2.5 mg total) by nebulization every 4 (four) hours as needed for wheezing or shortness of breath., Disp: 75 mL, Rfl: 1   albuterol  (VENTOLIN  HFA) 108 (90 Base) MCG/ACT inhaler, Inhale 2 puffs into the lungs every 4 (four) hours as needed for wheezing or shortness of breath., Disp: 1 each, Rfl: 5   azelastine  (ASTELIN ) 0.1 % nasal spray, 1 spray per nostril 1-2 times daily as needed., Disp: 30 mL, Rfl: 5   butalbital -acetaminophen -caffeine  (FIORICET) 50-325-40 MG tablet, Take 1 tablet by mouth every 6 (six) hours as needed for headache., Disp: 12 tablet, Rfl: 5   cetirizine  (ZYRTEC ) 10 MG tablet, Take 1  tablet (10 mg total) by mouth daily., Disp: 30 tablet, Rfl: 5   Cholecalciferol 10000 units TABS, Take 1,000 Units by mouth daily., Disp: , Rfl:    clobetasol (TEMOVATE) 0.05 % external solution, APPLY SMALL AMOUNT TO AFFECTED AREA DAILY AS NEEDED - RINSE IN SCALP. NEVER TO FACE., Disp: , Rfl:    dexlansoprazole  (DEXILANT ) 60 MG capsule, Take 1 capsule (60 mg total) by mouth daily., Disp: 90 capsule, Rfl: 1   dicyclomine (BENTYL) 10 MG capsule, Take 10 mg by mouth 4 (four) times daily -  before meals and at bedtime., Disp: , Rfl:    docusate sodium  (COLACE) 100 MG capsule, Take 100 mg by mouth 2 (two) times daily., Disp: , Rfl:    fluocinonide (LIDEX) 0.05 % external solution, APPLY SMALL AMOUNT TO AFFECTED AREA DAILY AS NEEDED, Disp: , Rfl:    fluticasone  (FLONASE ) 50 MCG/ACT nasal spray, Place 1 spray into both nostrils daily., Disp: 16 g, Rfl: 5   fluticasone -salmeterol (ADVAIR  HFA) 115-21 MCG/ACT inhaler, Inhale 2 puffs into the lungs 2 (two) times daily. (Patient not taking: Reported on 01/21/2024), Disp: 1 each, Rfl: 12   fluticasone -salmeterol (WIXELA INHUB) 250-50 MCG/ACT AEPB, Inhale 1 puff into the lungs in the morning and at bedtime. (Patient not taking: Reported on 01/21/2024), Disp: 60 each, Rfl: 5   hydroxychloroquine (PLAQUENIL) 200 MG tablet, Take by mouth daily., Disp: , Rfl:    hydrOXYzine  (VISTARIL ) 25 MG capsule, Take 1-2 tablets nightly, Disp: 60 capsule, Rfl: 5   IPRATROPIUM BROMIDE NA, Place 1 spray into the nose in the morning and at bedtime., Disp: , Rfl:    ipratropium-albuterol  (DUONEB) 0.5-2.5 (3) MG/3ML SOLN, Take 3 mLs by nebulization every 4 (four) hours as needed., Disp: 360 mL, Rfl: 3   ketotifen (ZADITOR) 0.025 % ophthalmic solution, INSTILL 1 DROP IN BOTH EYES TWICE A DAY AS NEEDED, Disp: , Rfl:    methylPREDNISolone  (MEDROL  DOSEPAK) 4 MG TBPK tablet, Take 6 pills on day one then decrease by 1 pill each day (Patient not taking: Reported on 01/21/2024), Disp: 21 tablet, Rfl: 0   metoprolol  tartrate (LOPRESSOR ) 25 MG tablet, Take 0.5 tablets (12.5 mg total) by mouth daily., Disp: 45 tablet, Rfl: 1   montelukast  (SINGULAIR ) 10 MG tablet, Take 1 tablet (10 mg total) by mouth at bedtime., Disp: 30 tablet, Rfl: 5   mycophenolate (CELLCEPT) 500 MG tablet, Take 500 mg by mouth 2 (two) times daily., Disp: , Rfl:    Nebulizer System All-In-One MISC, 1 Device by Does not apply route as needed., Disp: 1 each, Rfl: 0   nortriptyline  (PAMELOR ) 25 MG capsule, 1 tab in am, 2 tabs at HS, Disp: 270 capsule, Rfl: 1   Omega-3 Fatty  Acids (FISH OIL) 1000 MG CAPS, Take 1 capsule by mouth daily., Disp: , Rfl:    pantoprazole (PROTONIX) 40 MG tablet, Take 40 mg by mouth 2 (two) times daily., Disp: , Rfl:    Respiratory Therapy Supplies (NEBULIZER/TUBING/MOUTHPIECE) KIT, Use as directed., Disp: 1 kit, Rfl: 0   triamterene -hydrochlorothiazide (MAXZIDE-25) 37.5-25 MG tablet, Take 0.5 tablets by mouth daily., Disp: 45 tablet, Rfl: 1   vitamin E 400 UNIT capsule, Take 800 Units by mouth daily. , Disp: , Rfl:    Physical Exam:   BP 112/71   Pulse 94   SpO2 98%   Pertinent Findings  CN II-XII intact MMF wires in place, brackets sturdy with no loose screws, mucosal incision intact, no visible plates, minimal firm swelling noted along  the left external lateral mandible, no fluctuance, no overlying redness, submandibular incision clean dry and intact, no swelling. Mucosal incision CDI with small area of dehiscence at the level of the left lower canine with some yellow discharge, no swelling or tenderness No respiratory distress   Seprately Identifiable Procedures:  None  Impression & Plans:  Kimiye Strathman is a 73 y.o. female with the following   Mandible fracture-  73 year old female seen in our office for follow-up evaluation of mandible fracture status post open reduction internal fixation with MMF.  Overall she notes she is doing well.  Pain significantly improved.  On exam she did have an area of minimal dehiscence along the mucosa with some purulence, this area was not significantly swollen, no pain with palpation.  I discussed case with Dr. Roark, we will place her back on antibiotics for a week.  Wires were also removed, she tolerated this well.  She has been tolerating clindamycin , she was counseled on use of clindamycin  if she develops any concerning signs or symptoms she will reach out to our office.  Otherwise I like to see her back in the office in 1 week.  Strict return precautions given, she verbalized understanding and  agreement to today's plan.   - f/u 1 week   Thank you for allowing me the opportunity to care for your patient. Please do not hesitate to contact me should you have any other questions.  Sincerely, Chyrl Cohen PA-C [facility] Phone: [phone] Fax: [phone]  09/01/2024, 12:16 PM

## 2024-09-08 ENCOUNTER — Ambulatory Visit (INDEPENDENT_AMBULATORY_CARE_PROVIDER_SITE_OTHER): Admitting: Physician Assistant

## 2024-09-08 ENCOUNTER — Encounter (INDEPENDENT_AMBULATORY_CARE_PROVIDER_SITE_OTHER): Payer: Self-pay | Admitting: Physician Assistant

## 2024-09-08 VITALS — BP 105/64 | HR 69

## 2024-09-08 DIAGNOSIS — S02602D Fracture of unspecified part of body of left mandible, subsequent encounter for fracture with routine healing: Secondary | ICD-10-CM | POA: Diagnosis not present

## 2024-09-08 NOTE — Progress Notes (Unsigned)
 Dear Dr. Nicoletta, Here is my assessment for our mutual patient, Tracie Barnes. Thank you for allowing me the opportunity to care for your patient. Please do not hesitate to contact me should you have any other questions. Sincerely, Chyrl Cohen PA-C  Otolaryngology Clinic Note Referring provider: Dr. Nicoletta HPI:  Zamariya Neal is a 73 y.o. female kindly referred by Dr. Nicoletta   The patient is a 73 year old female seen in our office for follow-up evaluation status post open reduction internal fixation with MMF of mandibular fracture by Dr. Roark on 08/16/2024.  The patient was last seen in the office on 09/01/2024.  Below is recap of that encounter.  Update 09/08/2024  Since her last office visit the patient notes she has been doing well.  She denies any swelling, no discharge, she notes the pain has improved.  She has completed her oral antibiotics.  She notes persistent firmness at the left plate site with no worsening swelling, she notes the pain at that site has significantly improved but does note occasional discomfort there.  Overall she is happy with her progress.    Independent Review of Additional Tests or Records:  None   PMH/Meds/All/SocHx/FamHx/ROS:   Past Medical History:  Diagnosis Date   Allergy    Asthma    Chronic kidney disease    Dyspnea    Eczema    Fluid retention    Hyperlipidemia    Hypertension    Pre-diabetes    Systemic lupus erythematosus (HCC)    Unspecified vitamin D deficiency    Urticaria      Past Surgical History:  Procedure Laterality Date   COLONOSCOPY     COLONOSCOPY WITH PROPOFOL  N/A 06/30/2019   Procedure: COLONOSCOPY WITH PROPOFOL ;  Surgeon: Therisa Bi, MD;  Location: [facility] ENDOSCOPY;  Service: Gastroenterology;  Laterality: N/A;   ESOPHAGOGASTRODUODENOSCOPY     ORIF MANDIBULAR FRACTURE N/A 08/16/2024   Procedure: OPEN REDUCTION INTERNAL FIXATION (ORIF) MANDIBULAR FRACTURE;  Surgeon: Roark Rush, MD;  Location: [facility] OR;  Service: ENT;  Laterality:  N/A;  AND MMF SCREWS    Family History  Problem Relation Age of Onset   Hypertension Father      Social Connections: Not on file      Current Outpatient Medications:    ACIDOPHILUS LACTOBACILLUS PO, Take 2 tablets by mouth daily., Disp: , Rfl:    albuterol  (PROVENTIL ) (2.5 MG/3ML) 0.083% nebulizer solution, Take 3 mLs (2.5 mg total) by nebulization every 4 (four) hours as needed for wheezing or shortness of breath., Disp: 75 mL, Rfl: 1   albuterol  (VENTOLIN  HFA) 108 (90 Base) MCG/ACT inhaler, Inhale 2 puffs into the lungs every 4 (four) hours as needed for wheezing or shortness of breath., Disp: 1 each, Rfl: 5   azelastine  (ASTELIN ) 0.1 % nasal spray, 1 spray per nostril 1-2 times daily as needed., Disp: 30 mL, Rfl: 5   butalbital -acetaminophen -caffeine  (FIORICET) 50-325-40 MG tablet, Take 1 tablet by mouth every 6 (six) hours as needed for headache., Disp: 12 tablet, Rfl: 5   cetirizine  (ZYRTEC ) 10 MG tablet, Take 1 tablet (10 mg total) by mouth daily., Disp: 30 tablet, Rfl: 5   Cholecalciferol 10000 units TABS, Take 1,000 Units by mouth daily., Disp: , Rfl:    clindamycin  (CLEOCIN ) 300 MG capsule, Take 1 capsule (300 mg total) by mouth 3 (three) times daily., Disp: 21 capsule, Rfl: 0   clobetasol (TEMOVATE) 0.05 % external solution, APPLY SMALL AMOUNT TO AFFECTED AREA DAILY AS NEEDED - RINSE IN SCALP. NEVER  TO FACE., Disp: , Rfl:    dexlansoprazole  (DEXILANT ) 60 MG capsule, Take 1 capsule (60 mg total) by mouth daily., Disp: 90 capsule, Rfl: 1   dicyclomine (BENTYL) 10 MG capsule, Take 10 mg by mouth 4 (four) times daily -  before meals and at bedtime., Disp: , Rfl:    docusate sodium (COLACE) 100 MG capsule, Take 100 mg by mouth 2 (two) times daily., Disp: , Rfl:    fluocinonide (LIDEX) 0.05 % external solution, APPLY SMALL AMOUNT TO AFFECTED AREA DAILY AS NEEDED, Disp: , Rfl:    fluticasone  (FLONASE ) 50 MCG/ACT nasal spray, Place 1 spray into both nostrils daily., Disp: 16 g, Rfl: 5    fluticasone -salmeterol (ADVAIR  HFA) 115-21 MCG/ACT inhaler, Inhale 2 puffs into the lungs 2 (two) times daily. (Patient not taking: Reported on 01/21/2024), Disp: 1 each, Rfl: 12   fluticasone -salmeterol (WIXELA INHUB) 250-50 MCG/ACT AEPB, Inhale 1 puff into the lungs in the morning and at bedtime. (Patient not taking: Reported on 01/21/2024), Disp: 60 each, Rfl: 5   hydroxychloroquine (PLAQUENIL) 200 MG tablet, Take by mouth daily., Disp: , Rfl:    hydrOXYzine  (VISTARIL ) 25 MG capsule, Take 1-2 tablets nightly, Disp: 60 capsule, Rfl: 5   IPRATROPIUM BROMIDE NA, Place 1 spray into the nose in the morning and at bedtime., Disp: , Rfl:    ipratropium-albuterol  (DUONEB) 0.5-2.5 (3) MG/3ML SOLN, Take 3 mLs by nebulization every 4 (four) hours as needed., Disp: 360 mL, Rfl: 3   ketotifen (ZADITOR) 0.025 % ophthalmic solution, INSTILL 1 DROP IN BOTH EYES TWICE A DAY AS NEEDED, Disp: , Rfl:    methylPREDNISolone  (MEDROL  DOSEPAK) 4 MG TBPK tablet, Take 6 pills on day one then decrease by 1 pill each day (Patient not taking: Reported on 01/21/2024), Disp: 21 tablet, Rfl: 0   metoprolol  tartrate (LOPRESSOR ) 25 MG tablet, Take 0.5 tablets (12.5 mg total) by mouth daily., Disp: 45 tablet, Rfl: 1   montelukast  (SINGULAIR ) 10 MG tablet, Take 1 tablet (10 mg total) by mouth at bedtime., Disp: 30 tablet, Rfl: 5   mycophenolate (CELLCEPT) 500 MG tablet, Take 500 mg by mouth 2 (two) times daily., Disp: , Rfl:    Nebulizer System All-In-One MISC, 1 Device by Does not apply route as needed., Disp: 1 each, Rfl: 0   nortriptyline  (PAMELOR ) 25 MG capsule, 1 tab in am, 2 tabs at HS, Disp: 270 capsule, Rfl: 1   Omega-3 Fatty Acids (FISH OIL) 1000 MG CAPS, Take 1 capsule by mouth daily., Disp: , Rfl:    pantoprazole (PROTONIX) 40 MG tablet, Take 40 mg by mouth 2 (two) times daily., Disp: , Rfl:    Respiratory Therapy Supplies (NEBULIZER/TUBING/MOUTHPIECE) KIT, Use as directed., Disp: 1 kit, Rfl: 0    triamterene -hydrochlorothiazide (MAXZIDE-25) 37.5-25 MG tablet, Take 0.5 tablets by mouth daily., Disp: 45 tablet, Rfl: 1   vitamin E 400 UNIT capsule, Take 800 Units by mouth daily. , Disp: , Rfl:    Physical Exam:   BP 105/64   Pulse 69   SpO2 94%   Pertinent Findings  CN II-XII intact MMF wires in place, brackets sturdy with no loose screws, mucosal incision intact, no visible plates, minimal firm swelling noted along the left external lateral mandible, no fluctuance, no overlying redness, submandibular incision clean dry and intact, no swelling.   Seprately Identifiable Procedures:  None  Impression & Plans:  Dominiqua Cooner is a 73 y.o. female with the following   Mandibular fracture-  73 year old female seen in our  office for follow-up evaluation of mandible fracture.  She is doing very well.  No signs of infection on exam.  Pain significantly improved.  Approximately 3 weeks status post surgery.  The patient may continue on with liquid/very soft diet, no significant chewing.  I will discuss the case with Dr. Roark and plan for arch bar removals in the next several weeks.  The patient will not attempt removal in the office, this will require surgical management.   - f/u removal of arch bars to be determined   Thank you for allowing me the opportunity to care for your patient. Please do not hesitate to contact me should you have any other questions.  Sincerely, Chyrl Cohen PA-C [facility] Phone: [phone] Fax: [phone]  09/09/2024, 1:27 PM

## 2024-09-11 ENCOUNTER — Telehealth (INDEPENDENT_AMBULATORY_CARE_PROVIDER_SITE_OTHER): Payer: Self-pay | Admitting: Physician Assistant

## 2024-09-11 ENCOUNTER — Other Ambulatory Visit (INDEPENDENT_AMBULATORY_CARE_PROVIDER_SITE_OTHER): Payer: Self-pay | Admitting: Physician Assistant

## 2024-09-11 MED ORDER — CLINDAMYCIN HCL 300 MG PO CAPS: 300.0000 mg | ORAL_CAPSULE | Freq: Three times a day (TID) | ORAL | 0 refills | Status: DC

## 2024-09-11 NOTE — Telephone Encounter (Signed)
Thanks I spoke with her

## 2024-09-11 NOTE — Progress Notes (Signed)
 Patient called with recurrent swelling at the left jaw, concerning for infection.  She is status post open reduction internal fixation and MMF for mandibular fracture.  I will send in clindamycin , she will pick it up and start taking this.  She denies any fever or any spread of the swelling.  I will see her in the office on Monday, she will go to the emergency room if she develops any new or worsening signs or symptoms in the meantime.

## 2024-09-14 ENCOUNTER — Ambulatory Visit (INDEPENDENT_AMBULATORY_CARE_PROVIDER_SITE_OTHER): Admitting: Physician Assistant

## 2024-09-15 ENCOUNTER — Ambulatory Visit (INDEPENDENT_AMBULATORY_CARE_PROVIDER_SITE_OTHER): Admitting: Physician Assistant

## 2024-09-15 ENCOUNTER — Encounter (INDEPENDENT_AMBULATORY_CARE_PROVIDER_SITE_OTHER): Payer: Self-pay | Admitting: Physician Assistant

## 2024-09-15 VITALS — BP 96/63 | HR 71 | Temp 98.2°F

## 2024-09-15 DIAGNOSIS — Z9889 Other specified postprocedural states: Secondary | ICD-10-CM

## 2024-09-15 DIAGNOSIS — S02602D Fracture of unspecified part of body of left mandible, subsequent encounter for fracture with routine healing: Secondary | ICD-10-CM

## 2024-09-15 MED ORDER — OXYCODONE HCL 5 MG PO TABS: 5.0000 mg | ORAL_TABLET | Freq: Four times a day (QID) | ORAL | 0 refills | Status: AC | PRN

## 2024-09-16 NOTE — Progress Notes (Signed)
 Dear Dr. Nicoletta, Here is my assessment for our mutual patient, Tracie Barnes. Thank you for allowing me the opportunity to care for your patient. Please do not hesitate to contact me should you have any other questions. Sincerely, Tracie Cohen PA-C  Otolaryngology Clinic Note Referring provider: Dr. Nicoletta HPI:  Tracie Barnes is a 73 y.o. female kindly referred by Dr. Nicoletta   The patient is a 73 year old female seen in our office for follow-up evaluationstatus post open reduction internal fixation with MMF of mandibular fracture by Dr. Roark on 08/16/2024.   The patient was last seen in the office on 09/08/2024.  Since that time she did call the office noting some swelling in the left lower jaw.  She notes that swelling has improved.  She notes overall she is improving, no fever, no drainage.  She has maintained a soft diet.  No malocclusion.   Independent Review of Additional Tests or Records:  None   PMH/Meds/All/SocHx/FamHx/ROS:   Past Medical History:  Diagnosis Date   Allergy    Asthma    Chronic kidney disease    Dyspnea    Eczema    Fluid retention    Hyperlipidemia    Hypertension    Pre-diabetes    Systemic lupus erythematosus (HCC)    Unspecified vitamin D deficiency    Urticaria      Past Surgical History:  Procedure Laterality Date   COLONOSCOPY     COLONOSCOPY WITH PROPOFOL  N/A 06/30/2019   Procedure: COLONOSCOPY WITH PROPOFOL ;  Surgeon: Therisa Bi, MD;  Location[facility] ENDOSCOPY;  Service: Gastroenterology;  Laterality: N/A;   ESOPHAGOGASTRODUODENOSCOPY     ORIF MANDIBULAR FRACTURE N/A 08/16/2024   Procedure: OPEN REDUCTION INTERNAL FIXATION (ORIF) MANDIBULAR FRACTURE;  Surgeon: Roark Rush, MD;  Location: [facility] OR;  Service: ENT;  Laterality: N/A;  AND MMF SCREWS    Family History  Problem Relation Age of Onset   Hypertension Father      Social Connections: Not on file      Current Outpatient Medications:    oxyCODONE  (ROXICODONE ) 5 MG immediate release  tablet, Take 1 tablet (5 mg total) by mouth every 6 (six) hours as needed for up to 1 day for severe pain (pain score 7-10) or breakthrough pain. Crush and take with yogurt if painful to swallow pills, Disp: 4 tablet, Rfl: 0   ACIDOPHILUS LACTOBACILLUS PO, Take 2 tablets by mouth daily., Disp: , Rfl:    albuterol  (PROVENTIL ) (2.5 MG/3ML) 0.083% nebulizer solution, Take 3 mLs (2.5 mg total) by nebulization every 4 (four) hours as needed for wheezing or shortness of breath., Disp: 75 mL, Rfl: 1   albuterol  (VENTOLIN  HFA) 108 (90 Base) MCG/ACT inhaler, Inhale 2 puffs into the lungs every 4 (four) hours as needed for wheezing or shortness of breath., Disp: 1 each, Rfl: 5   azelastine  (ASTELIN ) 0.1 % nasal spray, 1 spray per nostril 1-2 times daily as needed., Disp: 30 mL, Rfl: 5   butalbital -acetaminophen -caffeine  (FIORICET) 50-325-40 MG tablet, Take 1 tablet by mouth every 6 (six) hours as needed for headache., Disp: 12 tablet, Rfl: 5   cetirizine  (ZYRTEC ) 10 MG tablet, Take 1 tablet (10 mg total) by mouth daily., Disp: 30 tablet, Rfl: 5   Cholecalciferol 10000 units TABS, Take 1,000 Units by mouth daily., Disp: , Rfl:    clindamycin  (CLEOCIN ) 300 MG capsule, Take 1 capsule (300 mg total) by mouth 3 (three) times daily., Disp: 21 capsule, Rfl: 0   clobetasol (TEMOVATE) 0.05 % external solution,  APPLY SMALL AMOUNT TO AFFECTED AREA DAILY AS NEEDED - RINSE IN SCALP. NEVER TO FACE., Disp: , Rfl:    dexlansoprazole  (DEXILANT ) 60 MG capsule, Take 1 capsule (60 mg total) by mouth daily., Disp: 90 capsule, Rfl: 1   dicyclomine (BENTYL) 10 MG capsule, Take 10 mg by mouth 4 (four) times daily -  before meals and at bedtime., Disp: , Rfl:    docusate sodium (COLACE) 100 MG capsule, Take 100 mg by mouth 2 (two) times daily., Disp: , Rfl:    fluocinonide (LIDEX) 0.05 % external solution, APPLY SMALL AMOUNT TO AFFECTED AREA DAILY AS NEEDED, Disp: , Rfl:    fluticasone  (FLONASE ) 50 MCG/ACT nasal spray, Place 1 spray  into both nostrils daily., Disp: 16 g, Rfl: 5   fluticasone -salmeterol (ADVAIR  HFA) 115-21 MCG/ACT inhaler, Inhale 2 puffs into the lungs 2 (two) times daily. (Patient not taking: Reported on 01/21/2024), Disp: 1 each, Rfl: 12   fluticasone -salmeterol (WIXELA INHUB) 250-50 MCG/ACT AEPB, Inhale 1 puff into the lungs in the morning and at bedtime. (Patient not taking: Reported on 01/21/2024), Disp: 60 each, Rfl: 5   hydroxychloroquine (PLAQUENIL) 200 MG tablet, Take by mouth daily., Disp: , Rfl:    hydrOXYzine  (VISTARIL ) 25 MG capsule, Take 1-2 tablets nightly, Disp: 60 capsule, Rfl: 5   IPRATROPIUM BROMIDE NA, Place 1 spray into the nose in the morning and at bedtime., Disp: , Rfl:    ipratropium-albuterol  (DUONEB) 0.5-2.5 (3) MG/3ML SOLN, Take 3 mLs by nebulization every 4 (four) hours as needed., Disp: 360 mL, Rfl: 3   ketotifen (ZADITOR) 0.025 % ophthalmic solution, INSTILL 1 DROP IN BOTH EYES TWICE A DAY AS NEEDED, Disp: , Rfl:    methylPREDNISolone  (MEDROL  DOSEPAK) 4 MG TBPK tablet, Take 6 pills on day one then decrease by 1 pill each day (Patient not taking: Reported on 01/21/2024), Disp: 21 tablet, Rfl: 0   metoprolol  tartrate (LOPRESSOR ) 25 MG tablet, Take 0.5 tablets (12.5 mg total) by mouth daily., Disp: 45 tablet, Rfl: 1   montelukast  (SINGULAIR ) 10 MG tablet, Take 1 tablet (10 mg total) by mouth at bedtime., Disp: 30 tablet, Rfl: 5   mycophenolate (CELLCEPT) 500 MG tablet, Take 500 mg by mouth 2 (two) times daily., Disp: , Rfl:    Nebulizer System All-In-One MISC, 1 Device by Does not apply route as needed., Disp: 1 each, Rfl: 0   nortriptyline  (PAMELOR ) 25 MG capsule, 1 tab in am, 2 tabs at HS, Disp: 270 capsule, Rfl: 1   Omega-3 Fatty Acids (FISH OIL) 1000 MG CAPS, Take 1 capsule by mouth daily., Disp: , Rfl:    pantoprazole (PROTONIX) 40 MG tablet, Take 40 mg by mouth 2 (two) times daily., Disp: , Rfl:    Respiratory Therapy Supplies (NEBULIZER/TUBING/MOUTHPIECE) KIT, Use as directed.,  Disp: 1 kit, Rfl: 0   triamterene -hydrochlorothiazide (MAXZIDE-25) 37.5-25 MG tablet, Take 0.5 tablets by mouth daily., Disp: 45 tablet, Rfl: 1   vitamin E 400 UNIT capsule, Take 800 Units by mouth daily. , Disp: , Rfl:    Physical Exam:   BP 96/63   Pulse 71   Temp 98.2 F (36.8 C)   SpO2 98%   Pertinent Findings  CN II-XII intact No malocclusion, buccal incision clean dry and intact, no purulence, some firm swelling noted the left lateral jaw, no fluctuance, no overlying redness.  Seprately Identifiable Procedures:  None  Impression & Plans:  Tracie Barnes is a 73 y.o. female with the following   Mandible fracture-  73 year old  female seen in our office for follow-up evaluation of mandible fracture.  Dr. Roark had the opportunity to personally evaluate her.  She does have some swelling of that left fracture site no signs of infection.  Overall she appears to be improving.  We discussed removing the arch bars in clinic versus OR, she elected proceed with removal arch bar in the clinic.  The large ports were removed after injection of approximately 5 cc of 1% lidocaine  with epinephrine .  She tolerated this very well, no complications.  At this point the patient will follow-up in 1 to 2 weeks for repeat evaluation, sooner as needed.  She verbalized understanding and agreement to today's plan had no further questions or concerns.   - f/u 1 to 2 weeks   Thank you for allowing me the opportunity to care for your patient. Please do not hesitate to contact me should you have any other questions.  Sincerely, Tracie Cohen PA-C [facility] Phone: [phone] Fax: [phone]  09/16/2024, 11:46 AM

## 2024-09-22 ENCOUNTER — Encounter (INDEPENDENT_AMBULATORY_CARE_PROVIDER_SITE_OTHER): Payer: Self-pay | Admitting: Physician Assistant

## 2024-09-22 ENCOUNTER — Ambulatory Visit (INDEPENDENT_AMBULATORY_CARE_PROVIDER_SITE_OTHER): Admitting: Physician Assistant

## 2024-09-22 VITALS — BP 111/67 | HR 72 | Temp 97.6°F

## 2024-09-22 DIAGNOSIS — Z9889 Other specified postprocedural states: Secondary | ICD-10-CM

## 2024-09-22 DIAGNOSIS — S02602D Fracture of unspecified part of body of left mandible, subsequent encounter for fracture with routine healing: Secondary | ICD-10-CM

## 2024-09-22 NOTE — Progress Notes (Signed)
 Dear Dr. Nicoletta, Here is my assessment for our mutual patient, Tracie Barnes. Thank you for allowing me the opportunity to care for your patient. Please do not hesitate to contact me should you have any other questions. Sincerely, Chyrl Cohen PA-C  Otolaryngology Clinic Note Referring provider: Dr. Nicoletta HPI:  Tracie Barnes is a 73 y.o. female kindly referred by Dr. Nicoletta   The patient is a 73 year old female seen in our office for follow-up evaluation status post open reduction internal fixation with MMF of mandibular fracture by Dr. Roark on 08/16/2024.  The patient was last seen in the office on 09/15/2024.  Since her last office visit she notes she has been doing well, significant improvement in pain.  Minimal pain over the left jaw, no change in swelling, no discharge, no fever, no malocclusion.  Independent Review of Additional Tests or Records:  None   PMH/Meds/All/SocHx/FamHx/ROS:   Past Medical History:  Diagnosis Date   Allergy    Asthma    Chronic kidney disease    Dyspnea    Eczema    Fluid retention    Hyperlipidemia    Hypertension    Pre-diabetes    Systemic lupus erythematosus (HCC)    Unspecified vitamin D deficiency    Urticaria      Past Surgical History:  Procedure Laterality Date   COLONOSCOPY     COLONOSCOPY WITH PROPOFOL  N/A 06/30/2019   Procedure: COLONOSCOPY WITH PROPOFOL ;  Surgeon: Therisa Bi, MD;  Location: [facility] ENDOSCOPY;  Service: Gastroenterology;  Laterality: N/A;   ESOPHAGOGASTRODUODENOSCOPY     ORIF MANDIBULAR FRACTURE N/A 08/16/2024   Procedure: OPEN REDUCTION INTERNAL FIXATION (ORIF) MANDIBULAR FRACTURE;  Surgeon: Roark Rush, MD;  Location: [facility] OR;  Service: ENT;  Laterality: N/A;  AND MMF SCREWS    Family History  Problem Relation Age of Onset   Hypertension Father      Social Connections: Not on file      Current Outpatient Medications:    ACIDOPHILUS LACTOBACILLUS PO, Take 2 tablets by mouth daily., Disp: , Rfl:    albuterol   (PROVENTIL ) (2.5 MG/3ML) 0.083% nebulizer solution, Take 3 mLs (2.5 mg total) by nebulization every 4 (four) hours as needed for wheezing or shortness of breath., Disp: 75 mL, Rfl: 1   albuterol  (VENTOLIN  HFA) 108 (90 Base) MCG/ACT inhaler, Inhale 2 puffs into the lungs every 4 (four) hours as needed for wheezing or shortness of breath., Disp: 1 each, Rfl: 5   azelastine  (ASTELIN ) 0.1 % nasal spray, 1 spray per nostril 1-2 times daily as needed., Disp: 30 mL, Rfl: 5   butalbital -acetaminophen -caffeine  (FIORICET) 50-325-40 MG tablet, Take 1 tablet by mouth every 6 (six) hours as needed for headache., Disp: 12 tablet, Rfl: 5   cetirizine  (ZYRTEC ) 10 MG tablet, Take 1 tablet (10 mg total) by mouth daily., Disp: 30 tablet, Rfl: 5   Cholecalciferol 10000 units TABS, Take 1,000 Units by mouth daily., Disp: , Rfl:    clindamycin  (CLEOCIN ) 300 MG capsule, Take 1 capsule (300 mg total) by mouth 3 (three) times daily., Disp: 21 capsule, Rfl: 0   clobetasol (TEMOVATE) 0.05 % external solution, APPLY SMALL AMOUNT TO AFFECTED AREA DAILY AS NEEDED - RINSE IN SCALP. NEVER TO FACE., Disp: , Rfl:    dexlansoprazole  (DEXILANT ) 60 MG capsule, Take 1 capsule (60 mg total) by mouth daily., Disp: 90 capsule, Rfl: 1   dicyclomine (BENTYL) 10 MG capsule, Take 10 mg by mouth 4 (four) times daily -  before meals and at bedtime.,  Disp: , Rfl:    docusate sodium (COLACE) 100 MG capsule, Take 100 mg by mouth 2 (two) times daily., Disp: , Rfl:    fluocinonide (LIDEX) 0.05 % external solution, APPLY SMALL AMOUNT TO AFFECTED AREA DAILY AS NEEDED, Disp: , Rfl:    fluticasone  (FLONASE ) 50 MCG/ACT nasal spray, Place 1 spray into both nostrils daily., Disp: 16 g, Rfl: 5   fluticasone -salmeterol (ADVAIR  HFA) 115-21 MCG/ACT inhaler, Inhale 2 puffs into the lungs 2 (two) times daily. (Patient not taking: Reported on 01/21/2024), Disp: 1 each, Rfl: 12   fluticasone -salmeterol (WIXELA INHUB) 250-50 MCG/ACT AEPB, Inhale 1 puff into the lungs in  the morning and at bedtime. (Patient not taking: Reported on 01/21/2024), Disp: 60 each, Rfl: 5   hydroxychloroquine (PLAQUENIL) 200 MG tablet, Take by mouth daily., Disp: , Rfl:    hydrOXYzine  (VISTARIL ) 25 MG capsule, Take 1-2 tablets nightly, Disp: 60 capsule, Rfl: 5   IPRATROPIUM BROMIDE NA, Place 1 spray into the nose in the morning and at bedtime., Disp: , Rfl:    ipratropium-albuterol  (DUONEB) 0.5-2.5 (3) MG/3ML SOLN, Take 3 mLs by nebulization every 4 (four) hours as needed., Disp: 360 mL, Rfl: 3   ketotifen (ZADITOR) 0.025 % ophthalmic solution, INSTILL 1 DROP IN BOTH EYES TWICE A DAY AS NEEDED, Disp: , Rfl:    methylPREDNISolone  (MEDROL  DOSEPAK) 4 MG TBPK tablet, Take 6 pills on day one then decrease by 1 pill each day (Patient not taking: Reported on 01/21/2024), Disp: 21 tablet, Rfl: 0   metoprolol  tartrate (LOPRESSOR ) 25 MG tablet, Take 0.5 tablets (12.5 mg total) by mouth daily., Disp: 45 tablet, Rfl: 1   montelukast  (SINGULAIR ) 10 MG tablet, Take 1 tablet (10 mg total) by mouth at bedtime., Disp: 30 tablet, Rfl: 5   mycophenolate (CELLCEPT) 500 MG tablet, Take 500 mg by mouth 2 (two) times daily., Disp: , Rfl:    Nebulizer System All-In-One MISC, 1 Device by Does not apply route as needed., Disp: 1 each, Rfl: 0   nortriptyline  (PAMELOR ) 25 MG capsule, 1 tab in am, 2 tabs at HS, Disp: 270 capsule, Rfl: 1   Omega-3 Fatty Acids (FISH OIL) 1000 MG CAPS, Take 1 capsule by mouth daily., Disp: , Rfl:    pantoprazole (PROTONIX) 40 MG tablet, Take 40 mg by mouth 2 (two) times daily., Disp: , Rfl:    Respiratory Therapy Supplies (NEBULIZER/TUBING/MOUTHPIECE) KIT, Use as directed., Disp: 1 kit, Rfl: 0   triamterene -hydrochlorothiazide (MAXZIDE-25) 37.5-25 MG tablet, Take 0.5 tablets by mouth daily., Disp: 45 tablet, Rfl: 1   vitamin E 400 UNIT capsule, Take 800 Units by mouth daily. , Disp: , Rfl:    Physical Exam:   BP 111/67 (BP Location: Right Arm)   Pulse 72   Temp 97.6 F (36.4 C)    SpO2 98%   Pertinent Findings  CN II-XII intact No malocclusion, buccal incision clean dry and intact, no purulence, some firm swelling noted the left lateral jaw, no fluctuance, no overlying redness.   Seprately Identifiable Procedures:  None  Impression & Plans:  Tracie Barnes is a 73 y.o. female with the following   Mandible fracture-  Patient is doing well today, that left sided swelling is persistent although not fluctuant, and it appears her pain is improving.  No malocclusion.  I have instructed her she may slowly graduate her diet to more firm foods, if she has any pain whatsoever she will need to revert back to soft foods and liquids and reach out to  our office.  If she develops any new or worsening signs or symptoms including worsening swelling or pain she will reach out immediately.  If she has persistent swelling over the next month I have also advised her to reach out to our office for repeat evaluation.  The patient verbalized understanding and agreement to today's plan had no further questions or concerns.   - f/u PRN   Thank you for allowing me the opportunity to care for your patient. Please do not hesitate to contact me should you have any other questions.  Sincerely, Chyrl Cohen PA-C [facility] Phone: [phone] Fax: [phone]  09/22/2024, 3:48 PM

## 2024-09-24 ENCOUNTER — Ambulatory Visit: Admitting: Allergy & Immunology

## 2024-09-29 ENCOUNTER — Ambulatory Visit: Admitting: Allergy & Immunology

## 2024-09-29 ENCOUNTER — Other Ambulatory Visit: Payer: Self-pay

## 2024-09-29 ENCOUNTER — Encounter: Payer: Self-pay | Admitting: Allergy & Immunology

## 2024-09-29 VITALS — BP 118/68 | HR 73 | Temp 97.6°F | Resp 18 | Ht 64.17 in | Wt 129.2 lb

## 2024-09-29 DIAGNOSIS — J302 Other seasonal allergic rhinitis: Secondary | ICD-10-CM

## 2024-09-29 DIAGNOSIS — J4540 Moderate persistent asthma, uncomplicated: Secondary | ICD-10-CM | POA: Diagnosis not present

## 2024-09-29 DIAGNOSIS — G43809 Other migraine, not intractable, without status migrainosus: Secondary | ICD-10-CM

## 2024-09-29 DIAGNOSIS — J3089 Other allergic rhinitis: Secondary | ICD-10-CM

## 2024-09-29 DIAGNOSIS — K219 Gastro-esophageal reflux disease without esophagitis: Secondary | ICD-10-CM

## 2024-09-29 NOTE — Progress Notes (Signed)
 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 Moderate persistent asthma, uncomplicated - Lung testing looked excellent today.  - I think we are in a good spot with regards to your asthma control.  - We are not going to make any changes at all.  - Daily controller medication(s): Advair  115/21 mcg two puffs TWICE DAILY + Nucala every month - Prior to physical activity: albuterol  2 puffs 10-15 minutes before physical activity. - Rescue medications: albuterol  4 puffs every 4-6 hours as needed - Asthma control goals:  * Full participation in all desired activities (may need albuterol  before activity) * Albuterol  use two time or less a week on average (not counting use with activity) * Cough interfering with sleep two time or less a month * Oral steroids no more than once a year * No hospitalizations  2. Seasonal and perennial allergic rhinitis (grasses, ragweed, weeds, trees and dust mites) - We will plan to restart the shots every two weeks and build up more slowly.  - Make an appointment when you feel that you are able to restart them. - Continue with: Zyrtec  (cetirizine ) 10mg  tablet once daily and Flonase  (fluticasone ) one spray per nostril daily - Continue taking: Atrovent (ipratropium) one spray per nostril up to three times daily   3. Headaches - We put in that referral to see Tracie Barnes for management of the migraines.   4. Return in about 6 months  (around 03/29/2025). You can have the follow up appointment with Tracie Barnes or a Nurse Practicioner (our Nurse Practitioners are excellent and always have Physician oversight!).   Subjective:   Tracie Barnes is a 73 y.o. female presenting today for follow up of  Chief Complaint  Patient presents with   Follow-up    Receiving asthma infusions, fell and broke her jaw bone on aug 17    Tracie Barnes has a history of the following: Patient Active Problem List   Diagnosis Date Noted   Worsening headaches 03/28/2022   Chronic migraine w/o aura w/o status migrainosus, not intractable 03/28/2022   Seasonal and perennial allergic rhinitis 07/19/2021   Gastroesophageal reflux disease 07/19/2021   Moderate persistent asthma, uncomplicated 07/19/2021   Globus sensation 07/19/2021   Acute cystitis 10/04/2020   Shortness of breath 06/15/2020   Other forms of systemic lupus erythematosus (HCC) 04/27/2020   History of colonic polyps    Avascular necrosis of bone (HCC) 11/17/2018   Pain in joint of right hip 09/02/2018   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  Discussed the use of AI scribe software for clinical note transcription with the patient and/or guardian, who gave verbal consent to proceed.  Tracie Barnes is a 73 y.o. female presenting for a follow up visit.  She was last  seen in January 2025.  At that time, lung testing looked excellent.  We continue with Advair  115 mcg 2 puffs twice daily as well as Spiriva 1.25 mcg 2 puffs once daily and Nucala monthly.  For her rhinitis, we continue with Zyrtec  as well as Flonase  and Astelin .  We did space out her shots to every 2 weeks due to her adverse reactions to shots.  For her headaches, we recommended that she has close care with neurology  again.  Since last visit, she has done fairly well.  On August 17th, she experienced a fall resulting in a jaw fracture in three places, necessitating surgical repair with ten stitches. Post-surgery, she has faced ongoing dental issues due to broken teeth not being properly addressed, leading to potential loss of additional teeth. Her teeth are not healing well and appear discolored. She has had multiple infections post-surgery and has been on antibiotics for over two weeks. She previously experienced adverse reactions to metal in her mouth, causing swelling and necessitating early removal of the metal. Her daughter and son have been supportive, with her son staying with her since the incident.     Asthma/Respiratory Symptom History: She has a history of asthma and is currently on Advair  twice daily. She receives monthly Nucala infusions at the [facility] in [place]. She is not currently using Spiriva.   Allergic Rhinitis Symptom History: She remains on the Flonase  and ipratropium. She has a history of environmental allergies and was previously on allergy shots, which she has paused due to recent health issues. She experienced a 'lightning sensation' following the shots and is not interested in restarting them until after Thanksgiving.  She has a history of migraines and is seeking a referral to Dr. Donny for further management, as she has not seen him for migraines in several years.  Otherwise, there have been no changes to her past medical history, surgical history, family history, or social history.    Review of systems otherwise negative other than that mentioned in the HPI.    Objective:   Blood pressure 118/68, pulse 73, temperature 97.6 F (36.4 C), temperature source Temporal, resp. rate 18, height 5' 4.17 (1.63 m), weight 129 lb 3.2 oz (58.6 kg), SpO2 100%. Body mass index is 22.06 kg/m.    Physical Exam Vitals reviewed.  Constitutional:      Appearance: She is well-developed.   HENT:     Head: Normocephalic and atraumatic.     Right Ear: Tympanic membrane, ear canal and external ear normal. No drainage, swelling or tenderness. Tympanic membrane is not injected, scarred, erythematous, retracted or bulging.     Left Ear: Tympanic membrane, ear canal and external ear normal. No drainage, swelling or tenderness. Tympanic membrane is not injected, scarred, erythematous, retracted or bulging.     Nose: No nasal deformity, septal deviation, mucosal edema or rhinorrhea.     Right Turbinates: Enlarged, swollen and pale.     Left Turbinates: Enlarged, swollen and pale.     Right Sinus: No maxillary sinus tenderness or frontal sinus tenderness.     Left Sinus: No maxillary sinus tenderness or frontal sinus tenderness.     Mouth/Throat:     Lips: Pink.     Mouth: Mucous membranes are moist. Mucous membranes are not pale and not dry.     Pharynx: Uvula midline.     Comments: Cobblestoning in the posterior oropharynx.  Eyes:     General: Lids are normal. Allergic shiner present.  Right eye: No discharge.        Left eye: No discharge.     Conjunctiva/sclera: Conjunctivae normal.     Right eye: Right conjunctiva is not injected. No chemosis.    Left eye: Left conjunctiva is not injected. No chemosis.    Pupils: Pupils are equal, round, and reactive to light.  Cardiovascular:     Rate and Rhythm: Normal rate and regular rhythm.     Heart sounds: Normal heart sounds.  Pulmonary:     Effort: Pulmonary effort is normal. No tachypnea, accessory muscle usage or respiratory distress.     Breath sounds: Normal breath sounds. No wheezing, rhonchi or rales.     Comments: Moving air well in all lung fields. No increased work of breathing noted.  Chest:     Chest wall: No tenderness.  Abdominal:     Tenderness: There is no abdominal tenderness. There is no guarding or rebound.  Lymphadenopathy:     Head:     Right side of head: No submandibular, tonsillar or occipital  adenopathy.     Left side of head: No submandibular, tonsillar or occipital adenopathy.     Cervical: No cervical adenopathy.  Skin:    Coloration: Skin is not pale.     Findings: No abrasion, erythema, petechiae or rash. Rash is not papular, urticarial or vesicular.  Neurological:     Mental Status: She is alert.  Psychiatric:        Behavior: Behavior is cooperative.      Diagnostic studies:    Spirometry: results normal (FEV1: 1.35/75%, FVC: 2.67/115%, FEV1/FVC: 51%).    Spirometry consistent with normal pattern.   Allergy Studies: none        Marty Shaggy, MD  [facility] 

## 2024-09-29 NOTE — Patient Instructions (Addendum)
 1. Moderate persistent asthma, uncomplicated - Lung testing looked excellent today.  - I think we are in a good spot with regards to your asthma control.  - We are not going to make any changes at all.  - Daily controller medication(s): Advair  115/21 mcg two puffs TWICE DAILY + Nucala every month - Prior to physical activity: albuterol  2 puffs 10-15 minutes before physical activity. - Rescue medications: albuterol  4 puffs every 4-6 hours as needed - Asthma control goals:  * Full participation in all desired activities (may need albuterol  before activity) * Albuterol  use two time or less a week on average (not counting use with activity) * Cough interfering with sleep two time or less a month * Oral steroids no more than once a year * No hospitalizations  2. Seasonal and perennial allergic rhinitis (grasses, ragweed, weeds, trees and dust mites) - We will plan to restart the shots every two weeks and build up more slowly.  - Make an appointment when you feel that you are able to restart them. - Continue with: Zyrtec  (cetirizine ) 10mg  tablet once daily and Flonase  (fluticasone ) one spray per nostril daily - Continue taking: Atrovent (ipratropium) one spray per nostril up to three times daily   3. Headaches - We put in that referral to see Dr. Onita for management of the migraines.   4. Return in about 6 months (around 03/29/2025). You can have the follow up appointment with Dr. Iva or a Nurse Practicioner (our Nurse Practitioners are excellent and always have Physician oversight!).    Please inform us  of any Emergency Department visits, hospitalizations, or changes in symptoms. Call us  before going to the ED for breathing or allergy symptoms since we might be able to fit you in for a sick visit. Feel free to contact us  anytime with any questions, problems, or concerns.  It was a pleasure to see you again today!  Websites that have reliable patient information: 1. American Academy of  Asthma, Allergy, and Immunology: www.aaaai.org 2. Food Allergy Research and Education (FARE): foodallergy.org 3. Mothers of Asthmatics: http://www.asthmacommunitynetwork.org 4. American College of Allergy, Asthma, and Immunology: www.acaai.org      "Like" us  on Facebook and Instagram for our latest updates!      A healthy democracy works best when [employer] participate! Make sure you are registered to vote! If you have moved or changed any of your contact information, you will need to get this updated before voting! Scan the QR codes below to learn more!

## 2024-10-09 ENCOUNTER — Other Ambulatory Visit (INDEPENDENT_AMBULATORY_CARE_PROVIDER_SITE_OTHER): Payer: Self-pay

## 2024-10-09 MED ORDER — CLINDAMYCIN HCL 300 MG PO CAPS
300.0000 mg | ORAL_CAPSULE | Freq: Three times a day (TID) | ORAL | 0 refills | Status: AC
Start: 2024-10-09 — End: ?

## 2024-10-13 ENCOUNTER — Encounter (HOSPITAL_BASED_OUTPATIENT_CLINIC_OR_DEPARTMENT_OTHER): Payer: Self-pay | Admitting: *Deleted

## 2024-10-13 ENCOUNTER — Ambulatory Visit (INDEPENDENT_AMBULATORY_CARE_PROVIDER_SITE_OTHER): Admitting: Physician Assistant

## 2024-10-13 ENCOUNTER — Encounter (INDEPENDENT_AMBULATORY_CARE_PROVIDER_SITE_OTHER): Payer: Self-pay | Admitting: Physician Assistant

## 2024-10-13 ENCOUNTER — Other Ambulatory Visit: Payer: Self-pay

## 2024-10-13 VITALS — BP 101/64 | HR 92 | Temp 97.8°F

## 2024-10-13 DIAGNOSIS — Z9889 Other specified postprocedural states: Secondary | ICD-10-CM

## 2024-10-13 DIAGNOSIS — S02602D Fracture of unspecified part of body of left mandible, subsequent encounter for fracture with routine healing: Secondary | ICD-10-CM

## 2024-10-13 NOTE — H&P (View-Only) (Signed)
 Dear Dr. Nicoletta, Here is my assessment for our mutual patient, Tracie Barnes. Thank you for allowing me the opportunity to care for your patient. Please do not hesitate to contact me should you have any other questions. Sincerely, Chyrl Cohen PA-C  Otolaryngology Clinic Note Referring provider: Dr. Nicoletta HPI:  Tracie Barnes is a 73 y.o. female kindly referred by Dr. Nicoletta   The patient is a 73 year old female seen in our office for follow-up evaluation status post open reduction internal fixation with MMF of mandibular fracture by Dr. Roark on 08/16/2024.  The patient was last seen in the office on 09/22/2024.  Postoperatively she has had intermittent swelling along the left lateral jaw at her fracture site.  She has been on multiple rounds of antibiotics.  Most recently she saw her dentist who was concerned for infection with some purulence from the left jaw.  She was placed on clindamycin .  Today in office she notes the recurrence of the swelling, she denies any significant pain, she feels like her jaw is tight with no laxity, no malocclusion.   Independent Review of Additional Tests or Records:  None   PMH/Meds/All/SocHx/FamHx/ROS:   Past Medical History:  Diagnosis Date   Allergy    Asthma    Chronic kidney disease    Dyspnea    Eczema    Fluid retention    Hyperlipidemia    Hypertension    Pre-diabetes    Systemic lupus erythematosus (HCC)    Unspecified vitamin D deficiency    Urticaria      Past Surgical History:  Procedure Laterality Date   COLONOSCOPY     COLONOSCOPY WITH PROPOFOL  N/A 06/30/2019   Procedure: COLONOSCOPY WITH PROPOFOL ;  Surgeon: Therisa Bi, MD;  Location: [facility] ENDOSCOPY;  Service: Gastroenterology;  Laterality: N/A;   ESOPHAGOGASTRODUODENOSCOPY     ORIF MANDIBULAR FRACTURE N/A 08/16/2024   Procedure: OPEN REDUCTION INTERNAL FIXATION (ORIF) MANDIBULAR FRACTURE;  Surgeon: Roark Rush, MD;  Location: [facility];  Service: ENT;  Laterality: N/A;  AND MMF SCREWS     Family History  Problem Relation Age of Onset   Hypertension Father      Social Connections: Not on file      Current Outpatient Medications:    ACIDOPHILUS LACTOBACILLUS PO, Take 2 tablets by mouth daily., Disp: , Rfl:    albuterol  (PROVENTIL ) (2.5 MG/3ML) 0.083% nebulizer solution, Take 3 mLs (2.5 mg total) by nebulization every 4 (four) hours as needed for wheezing or shortness of breath., Disp: 75 mL, Rfl: 1   albuterol  (VENTOLIN  HFA) 108 (90 Base) MCG/ACT inhaler, Inhale 2 puffs into the lungs every 4 (four) hours as needed for wheezing or shortness of breath., Disp: 1 each, Rfl: 5   azelastine  (ASTELIN ) 0.1 % nasal spray, 1 spray per nostril 1-2 times daily as needed., Disp: 30 mL, Rfl: 5   butalbital -acetaminophen -caffeine  (FIORICET) 50-325-40 MG tablet, Take 1 tablet by mouth every 6 (six) hours as needed for headache. (Patient not taking: Reported on 09/29/2024), Disp: 12 tablet, Rfl: 5   cetirizine  (ZYRTEC ) 10 MG tablet, Take 1 tablet (10 mg total) by mouth daily., Disp: 30 tablet, Rfl: 5   Cholecalciferol 10000 units TABS, Take 1,000 Units by mouth daily., Disp: , Rfl:    clindamycin  (CLEOCIN ) 300 MG capsule, Take 1 capsule (300 mg total) by mouth 3 (three) times daily., Disp: 21 capsule, Rfl: 0   clobetasol (TEMOVATE) 0.05 % external solution, APPLY SMALL AMOUNT TO AFFECTED AREA DAILY AS NEEDED - RINSE IN SCALP.  NEVER TO FACE., Disp: , Rfl:    dexlansoprazole  (DEXILANT ) 60 MG capsule, Take 1 capsule (60 mg total) by mouth daily. (Patient not taking: Reported on 09/29/2024), Disp: 90 capsule, Rfl: 1   dicyclomine (BENTYL) 10 MG capsule, Take 10 mg by mouth 4 (four) times daily -  before meals and at bedtime. (Patient not taking: Reported on 09/29/2024), Disp: , Rfl:    docusate sodium (COLACE) 100 MG capsule, Take 100 mg by mouth 2 (two) times daily. (Patient not taking: Reported on 09/29/2024), Disp: , Rfl:    fluocinonide (LIDEX) 0.05 % external solution, APPLY SMALL AMOUNT TO  AFFECTED AREA DAILY AS NEEDED (Patient not taking: Reported on 09/29/2024), Disp: , Rfl:    fluticasone  (FLONASE ) 50 MCG/ACT nasal spray, Place 1 spray into both nostrils daily. (Patient not taking: Reported on 09/29/2024), Disp: 16 g, Rfl: 5   fluticasone -salmeterol (ADVAIR  HFA) 115-21 MCG/ACT inhaler, Inhale 2 puffs into the lungs 2 (two) times daily. (Patient not taking: Reported on 01/21/2024), Disp: 1 each, Rfl: 12   fluticasone -salmeterol (WIXELA INHUB) 250-50 MCG/ACT AEPB, Inhale 1 puff into the lungs in the morning and at bedtime. (Patient not taking: Reported on 01/21/2024), Disp: 60 each, Rfl: 5   hydroxychloroquine (PLAQUENIL) 200 MG tablet, Take by mouth daily., Disp: , Rfl:    hydrOXYzine  (VISTARIL ) 25 MG capsule, Take 1-2 tablets nightly, Disp: 60 capsule, Rfl: 5   IPRATROPIUM BROMIDE NA, Place 1 spray into the nose in the morning and at bedtime. (Patient not taking: Reported on 09/29/2024), Disp: , Rfl:    ipratropium-albuterol  (DUONEB) 0.5-2.5 (3) MG/3ML SOLN, Take 3 mLs by nebulization every 4 (four) hours as needed., Disp: 360 mL, Rfl: 3   ketotifen (ZADITOR) 0.025 % ophthalmic solution, INSTILL 1 DROP IN BOTH EYES TWICE A DAY AS NEEDED (Patient not taking: Reported on 09/29/2024), Disp: , Rfl:    methylPREDNISolone  (MEDROL  DOSEPAK) 4 MG TBPK tablet, Take 6 pills on day one then decrease by 1 pill each day (Patient not taking: Reported on 01/21/2024), Disp: 21 tablet, Rfl: 0   metoprolol  tartrate (LOPRESSOR ) 25 MG tablet, Take 0.5 tablets (12.5 mg total) by mouth daily., Disp: 45 tablet, Rfl: 1   montelukast  (SINGULAIR ) 10 MG tablet, Take 1 tablet (10 mg total) by mouth at bedtime., Disp: 30 tablet, Rfl: 5   mycophenolate (CELLCEPT) 500 MG tablet, Take 500 mg by mouth 2 (two) times daily. (Patient not taking: Reported on 09/29/2024), Disp: , Rfl:    Nebulizer System All-In-One MISC, 1 Device by Does not apply route as needed., Disp: 1 each, Rfl: 0   nortriptyline  (PAMELOR ) 25 MG capsule, 1  tab in am, 2 tabs at HS (Patient not taking: Reported on 09/29/2024), Disp: 270 capsule, Rfl: 1   Omega-3 Fatty Acids (FISH OIL) 1000 MG CAPS, Take 1 capsule by mouth daily. (Patient not taking: Reported on 09/29/2024), Disp: , Rfl:    pantoprazole (PROTONIX) 40 MG tablet, Take 40 mg by mouth 2 (two) times daily. (Patient not taking: Reported on 09/29/2024), Disp: , Rfl:    Respiratory Therapy Supplies (NEBULIZER/TUBING/MOUTHPIECE) KIT, Use as directed. (Patient not taking: Reported on 09/29/2024), Disp: 1 kit, Rfl: 0   triamterene -hydrochlorothiazide (MAXZIDE-25) 37.5-25 MG tablet, Take 0.5 tablets by mouth daily. (Patient not taking: Reported on 09/29/2024), Disp: 45 tablet, Rfl: 1   vitamin E 400 UNIT capsule, Take 800 Units by mouth daily. , Disp: , Rfl:    Physical Exam:   BP 101/64   Pulse 92   Temp 97.8  F (36.6 C)   SpO2 98%   Pertinent Findings  CN II-XII intact Bilateral pinna with no abnormalities No lesions of oral cavity/oropharynx; dentition within normal limits No malocclusion, buccal incision clean with some granulation tissue along the anterior lateral jaw, no purulence noted, no laxity of the jaw, firm swelling noted along the left lateral jaw, neck supple No obvious neck masses/lymphadenopathy/thyromegaly No respiratory distress or stridor  Seprately Identifiable Procedures:  None  Impression & Plans:  Tracie Barnes is a 73 y.o. female with the following   Mandibular fracture-   73 year old female seen in our office for follow-up evaluation of left mandibular fracture.  Dr. Roark had the opportunity to personally evaluate the patient.  She has had intermittent swelling along the left fracture site at the site of the plate.  She also has some granulation tissue along the incision.  There is concern that she has infection at the plate site which is causing her ongoing symptoms.  Given the reoccurrence and ongoing symptoms Dr. Roark had a discussion with the patient including  risks, benefits, and options including conservative approach versus operative intervention with removing the hardware.  He had a detailed discussion with her including potentially placing new hardware versus simply removing the old hardware, washing out the site.  The patient understood verbalized the risks and benefits and would like to proceed with surgery.  She had no further questions or concerns at today's visit.   - f/u 1 week postop    Thank you for allowing me the opportunity to care for your patient. Please do not hesitate to contact me should you have any other questions.  Sincerely, Chyrl Cohen PA-C [facility] Phone: [phone] Fax: [phone]  10/13/2024, 10:48 AM

## 2024-10-13 NOTE — Progress Notes (Signed)
 Dear Dr. Nicoletta, Here is my assessment for our mutual patient, Tracie Barnes. Thank you for allowing me the opportunity to care for your patient. Please do not hesitate to contact me should you have any other questions. Sincerely, Chyrl Cohen PA-C  Otolaryngology Clinic Note Referring provider: Dr. Nicoletta HPI:  Tracie Barnes is a 73 y.o. female kindly referred by Dr. Nicoletta   The patient is a 73 year old female seen in our office for follow-up evaluation status post open reduction internal fixation with MMF of mandibular fracture by Dr. Roark on 08/16/2024.  The patient was last seen in the office on 09/22/2024.  Postoperatively she has had intermittent swelling along the left lateral jaw at her fracture site.  She has been on multiple rounds of antibiotics.  Most recently she saw her dentist who was concerned for infection with some purulence from the left jaw.  She was placed on clindamycin .  Today in office she notes the recurrence of the swelling, she denies any significant pain, she feels like her jaw is tight with no laxity, no malocclusion.   Independent Review of Additional Tests or Records:  None   PMH/Meds/All/SocHx/FamHx/ROS:   Past Medical History:  Diagnosis Date   Allergy    Asthma    Chronic kidney disease    Dyspnea    Eczema    Fluid retention    Hyperlipidemia    Hypertension    Pre-diabetes    Systemic lupus erythematosus (HCC)    Unspecified vitamin D deficiency    Urticaria      Past Surgical History:  Procedure Laterality Date   COLONOSCOPY     COLONOSCOPY WITH PROPOFOL  N/A 06/30/2019   Procedure: COLONOSCOPY WITH PROPOFOL ;  Surgeon: Therisa Bi, MD;  Location: [facility] ENDOSCOPY;  Service: Gastroenterology;  Laterality: N/A;   ESOPHAGOGASTRODUODENOSCOPY     ORIF MANDIBULAR FRACTURE N/A 08/16/2024   Procedure: OPEN REDUCTION INTERNAL FIXATION (ORIF) MANDIBULAR FRACTURE;  Surgeon: Roark Rush, MD;  Location: [facility];  Service: ENT;  Laterality: N/A;  AND MMF SCREWS     Family History  Problem Relation Age of Onset   Hypertension Father      Social Connections: Not on file      Current Outpatient Medications:    ACIDOPHILUS LACTOBACILLUS PO, Take 2 tablets by mouth daily., Disp: , Rfl:    albuterol  (PROVENTIL ) (2.5 MG/3ML) 0.083% nebulizer solution, Take 3 mLs (2.5 mg total) by nebulization every 4 (four) hours as needed for wheezing or shortness of breath., Disp: 75 mL, Rfl: 1   albuterol  (VENTOLIN  HFA) 108 (90 Base) MCG/ACT inhaler, Inhale 2 puffs into the lungs every 4 (four) hours as needed for wheezing or shortness of breath., Disp: 1 each, Rfl: 5   azelastine  (ASTELIN ) 0.1 % nasal spray, 1 spray per nostril 1-2 times daily as needed., Disp: 30 mL, Rfl: 5   butalbital -acetaminophen -caffeine  (FIORICET) 50-325-40 MG tablet, Take 1 tablet by mouth every 6 (six) hours as needed for headache. (Patient not taking: Reported on 09/29/2024), Disp: 12 tablet, Rfl: 5   cetirizine  (ZYRTEC ) 10 MG tablet, Take 1 tablet (10 mg total) by mouth daily., Disp: 30 tablet, Rfl: 5   Cholecalciferol 10000 units TABS, Take 1,000 Units by mouth daily., Disp: , Rfl:    clindamycin  (CLEOCIN ) 300 MG capsule, Take 1 capsule (300 mg total) by mouth 3 (three) times daily., Disp: 21 capsule, Rfl: 0   clobetasol (TEMOVATE) 0.05 % external solution, APPLY SMALL AMOUNT TO AFFECTED AREA DAILY AS NEEDED - RINSE IN SCALP.  NEVER TO FACE., Disp: , Rfl:    dexlansoprazole  (DEXILANT ) 60 MG capsule, Take 1 capsule (60 mg total) by mouth daily. (Patient not taking: Reported on 09/29/2024), Disp: 90 capsule, Rfl: 1   dicyclomine (BENTYL) 10 MG capsule, Take 10 mg by mouth 4 (four) times daily -  before meals and at bedtime. (Patient not taking: Reported on 09/29/2024), Disp: , Rfl:    docusate sodium (COLACE) 100 MG capsule, Take 100 mg by mouth 2 (two) times daily. (Patient not taking: Reported on 09/29/2024), Disp: , Rfl:    fluocinonide (LIDEX) 0.05 % external solution, APPLY SMALL AMOUNT TO  AFFECTED AREA DAILY AS NEEDED (Patient not taking: Reported on 09/29/2024), Disp: , Rfl:    fluticasone  (FLONASE ) 50 MCG/ACT nasal spray, Place 1 spray into both nostrils daily. (Patient not taking: Reported on 09/29/2024), Disp: 16 g, Rfl: 5   fluticasone -salmeterol (ADVAIR  HFA) 115-21 MCG/ACT inhaler, Inhale 2 puffs into the lungs 2 (two) times daily. (Patient not taking: Reported on 01/21/2024), Disp: 1 each, Rfl: 12   fluticasone -salmeterol (WIXELA INHUB) 250-50 MCG/ACT AEPB, Inhale 1 puff into the lungs in the morning and at bedtime. (Patient not taking: Reported on 01/21/2024), Disp: 60 each, Rfl: 5   hydroxychloroquine (PLAQUENIL) 200 MG tablet, Take by mouth daily., Disp: , Rfl:    hydrOXYzine  (VISTARIL ) 25 MG capsule, Take 1-2 tablets nightly, Disp: 60 capsule, Rfl: 5   IPRATROPIUM BROMIDE NA, Place 1 spray into the nose in the morning and at bedtime. (Patient not taking: Reported on 09/29/2024), Disp: , Rfl:    ipratropium-albuterol  (DUONEB) 0.5-2.5 (3) MG/3ML SOLN, Take 3 mLs by nebulization every 4 (four) hours as needed., Disp: 360 mL, Rfl: 3   ketotifen (ZADITOR) 0.025 % ophthalmic solution, INSTILL 1 DROP IN BOTH EYES TWICE A DAY AS NEEDED (Patient not taking: Reported on 09/29/2024), Disp: , Rfl:    methylPREDNISolone  (MEDROL  DOSEPAK) 4 MG TBPK tablet, Take 6 pills on day one then decrease by 1 pill each day (Patient not taking: Reported on 01/21/2024), Disp: 21 tablet, Rfl: 0   metoprolol  tartrate (LOPRESSOR ) 25 MG tablet, Take 0.5 tablets (12.5 mg total) by mouth daily., Disp: 45 tablet, Rfl: 1   montelukast  (SINGULAIR ) 10 MG tablet, Take 1 tablet (10 mg total) by mouth at bedtime., Disp: 30 tablet, Rfl: 5   mycophenolate (CELLCEPT) 500 MG tablet, Take 500 mg by mouth 2 (two) times daily. (Patient not taking: Reported on 09/29/2024), Disp: , Rfl:    Nebulizer System All-In-One MISC, 1 Device by Does not apply route as needed., Disp: 1 each, Rfl: 0   nortriptyline  (PAMELOR ) 25 MG capsule, 1  tab in am, 2 tabs at HS (Patient not taking: Reported on 09/29/2024), Disp: 270 capsule, Rfl: 1   Omega-3 Fatty Acids (FISH OIL) 1000 MG CAPS, Take 1 capsule by mouth daily. (Patient not taking: Reported on 09/29/2024), Disp: , Rfl:    pantoprazole (PROTONIX) 40 MG tablet, Take 40 mg by mouth 2 (two) times daily. (Patient not taking: Reported on 09/29/2024), Disp: , Rfl:    Respiratory Therapy Supplies (NEBULIZER/TUBING/MOUTHPIECE) KIT, Use as directed. (Patient not taking: Reported on 09/29/2024), Disp: 1 kit, Rfl: 0   triamterene -hydrochlorothiazide (MAXZIDE-25) 37.5-25 MG tablet, Take 0.5 tablets by mouth daily. (Patient not taking: Reported on 09/29/2024), Disp: 45 tablet, Rfl: 1   vitamin E 400 UNIT capsule, Take 800 Units by mouth daily. , Disp: , Rfl:    Physical Exam:   BP 101/64   Pulse 92   Temp 97.8  F (36.6 C)   SpO2 98%   Pertinent Findings  CN II-XII intact Bilateral pinna with no abnormalities No lesions of oral cavity/oropharynx; dentition within normal limits No malocclusion, buccal incision clean with some granulation tissue along the anterior lateral jaw, no purulence noted, no laxity of the jaw, firm swelling noted along the left lateral jaw, neck supple No obvious neck masses/lymphadenopathy/thyromegaly No respiratory distress or stridor  Seprately Identifiable Procedures:  None  Impression & Plans:  Robby Pirani is a 73 y.o. female with the following   Mandibular fracture-   73 year old female seen in our office for follow-up evaluation of left mandibular fracture.  Dr. Roark had the opportunity to personally evaluate the patient.  She has had intermittent swelling along the left fracture site at the site of the plate.  She also has some granulation tissue along the incision.  There is concern that she has infection at the plate site which is causing her ongoing symptoms.  Given the reoccurrence and ongoing symptoms Dr. Roark had a discussion with the patient including  risks, benefits, and options including conservative approach versus operative intervention with removing the hardware.  He had a detailed discussion with her including potentially placing new hardware versus simply removing the old hardware, washing out the site.  The patient understood verbalized the risks and benefits and would like to proceed with surgery.  She had no further questions or concerns at today's visit.   - f/u 1 week postop    Thank you for allowing me the opportunity to care for your patient. Please do not hesitate to contact me should you have any other questions.  Sincerely, Chyrl Cohen PA-C [facility] Phone: [phone] Fax: [phone]  10/13/2024, 10:48 AM

## 2024-10-14 ENCOUNTER — Other Ambulatory Visit: Payer: Self-pay

## 2024-10-14 ENCOUNTER — Encounter (HOSPITAL_BASED_OUTPATIENT_CLINIC_OR_DEPARTMENT_OTHER): Admission: RE | Disposition: A | Payer: Self-pay | Source: Home / Self Care | Attending: Otolaryngology

## 2024-10-14 ENCOUNTER — Ambulatory Visit (HOSPITAL_BASED_OUTPATIENT_CLINIC_OR_DEPARTMENT_OTHER)
Admission: RE | Admit: 2024-10-14 | Discharge: 2024-10-14 | Disposition: A | Discharge status: Home / Self Care | Attending: Otolaryngology | Admitting: Otolaryngology

## 2024-10-14 ENCOUNTER — Encounter (HOSPITAL_BASED_OUTPATIENT_CLINIC_OR_DEPARTMENT_OTHER): Payer: Self-pay

## 2024-10-14 ENCOUNTER — Ambulatory Visit (HOSPITAL_BASED_OUTPATIENT_CLINIC_OR_DEPARTMENT_OTHER): Admitting: Anesthesiology

## 2024-10-14 DIAGNOSIS — Z79624 Long term (current) use of inhibitors of nucleotide synthesis: Secondary | ICD-10-CM | POA: Diagnosis not present

## 2024-10-14 DIAGNOSIS — S02609D Fracture of mandible, unspecified, subsequent encounter for fracture with routine healing: Secondary | ICD-10-CM | POA: Diagnosis not present

## 2024-10-14 DIAGNOSIS — Z01818 Encounter for other preprocedural examination: Secondary | ICD-10-CM

## 2024-10-14 DIAGNOSIS — Z7951 Long term (current) use of inhaled steroids: Secondary | ICD-10-CM | POA: Diagnosis not present

## 2024-10-14 DIAGNOSIS — I129 Hypertensive chronic kidney disease with stage 1 through stage 4 chronic kidney disease, or unspecified chronic kidney disease: Secondary | ICD-10-CM | POA: Insufficient documentation

## 2024-10-14 DIAGNOSIS — R519 Headache, unspecified: Secondary | ICD-10-CM | POA: Diagnosis not present

## 2024-10-14 DIAGNOSIS — N189 Chronic kidney disease, unspecified: Secondary | ICD-10-CM | POA: Insufficient documentation

## 2024-10-14 DIAGNOSIS — M3214 Glomerular disease in systemic lupus erythematosus: Secondary | ICD-10-CM | POA: Insufficient documentation

## 2024-10-14 DIAGNOSIS — M797 Fibromyalgia: Secondary | ICD-10-CM | POA: Diagnosis not present

## 2024-10-14 DIAGNOSIS — K219 Gastro-esophageal reflux disease without esophagitis: Secondary | ICD-10-CM | POA: Diagnosis not present

## 2024-10-14 DIAGNOSIS — Z79899 Other long term (current) drug therapy: Secondary | ICD-10-CM | POA: Insufficient documentation

## 2024-10-14 DIAGNOSIS — J45909 Unspecified asthma, uncomplicated: Secondary | ICD-10-CM | POA: Insufficient documentation

## 2024-10-14 DIAGNOSIS — R609 Edema, unspecified: Secondary | ICD-10-CM | POA: Insufficient documentation

## 2024-10-14 DIAGNOSIS — Z4589 Encounter for adjustment and management of other implanted devices: Secondary | ICD-10-CM | POA: Diagnosis present

## 2024-10-14 DIAGNOSIS — I10 Essential (primary) hypertension: Secondary | ICD-10-CM | POA: Diagnosis not present

## 2024-10-14 DIAGNOSIS — Z472 Encounter for removal of internal fixation device: Secondary | ICD-10-CM

## 2024-10-14 DIAGNOSIS — E785 Hyperlipidemia, unspecified: Secondary | ICD-10-CM | POA: Diagnosis not present

## 2024-10-14 HISTORY — PX: MANDIBULAR HARDWARE REMOVAL: SHX5205

## 2024-10-14 SURGERY — REMOVAL, HARDWARE, MANDIBLE
Anesthesia: Monitor Anesthesia Care | Site: Mouth

## 2024-10-14 MED ORDER — ONDANSETRON HCL 4 MG/2ML IJ SOLN
INTRAMUSCULAR | Status: DC | PRN
  Administered 2024-10-14: 4 mg via INTRAVENOUS

## 2024-10-14 MED ORDER — FENTANYL CITRATE (PF) 100 MCG/2ML IJ SOLN: 25.0000 ug | INTRAMUSCULAR | Status: DC | PRN

## 2024-10-14 MED ORDER — ONDANSETRON HCL 4 MG/2ML IJ SOLN: 4.0000 mg | Freq: Once | INTRAMUSCULAR | Status: DC | PRN

## 2024-10-14 MED ORDER — ROCURONIUM BROMIDE 10 MG/ML (PF) SYRINGE
PREFILLED_SYRINGE | INTRAVENOUS | Status: AC
  Filled 2024-10-14: qty 10

## 2024-10-14 MED ORDER — SUGAMMADEX SODIUM 200 MG/2ML IV SOLN
INTRAVENOUS | Status: DC | PRN
  Administered 2024-10-14: 200 mg via INTRAVENOUS

## 2024-10-14 MED ORDER — FENTANYL CITRATE (PF) 100 MCG/2ML IJ SOLN
INTRAMUSCULAR | Status: AC
  Filled 2024-10-14: qty 2

## 2024-10-14 MED ORDER — LIDOCAINE-EPINEPHRINE 1 %-1:100000 IJ SOLN
INTRAMUSCULAR | Status: DC | PRN
  Administered 2024-10-14: 5 mL

## 2024-10-14 MED ORDER — PROPOFOL 500 MG/50ML IV EMUL
INTRAVENOUS | Status: AC
  Filled 2024-10-14: qty 50

## 2024-10-14 MED ORDER — 0.9 % SODIUM CHLORIDE (POUR BTL) OPTIME
TOPICAL | Status: DC | PRN
  Administered 2024-10-14: 1000 mL

## 2024-10-14 MED ORDER — PROPOFOL 10 MG/ML IV BOLUS
INTRAVENOUS | Status: AC
  Filled 2024-10-14: qty 20

## 2024-10-14 MED ORDER — PROPOFOL 500 MG/50ML IV EMUL
INTRAVENOUS | Status: DC | PRN
Start: 2024-10-14 — End: 2024-10-14
  Administered 2024-10-14: 25 ug/kg/min via INTRAVENOUS

## 2024-10-14 MED ORDER — PROPOFOL 10 MG/ML IV BOLUS
INTRAVENOUS | Status: DC | PRN
  Administered 2024-10-14: 50 mg via INTRAVENOUS
  Administered 2024-10-14: 200 mg via INTRAVENOUS

## 2024-10-14 MED ORDER — OXYCODONE HCL 5 MG/5ML PO SOLN: 5.0000 mg | Freq: Once | ORAL | Status: DC | PRN

## 2024-10-14 MED ORDER — GLYCOPYRROLATE 0.2 MG/ML IJ SOLN
INTRAMUSCULAR | Status: DC | PRN
  Administered 2024-10-14: .2 mg via INTRAVENOUS

## 2024-10-14 MED ORDER — PHENYLEPHRINE HCL (PRESSORS) 10 MG/ML IV SOLN
INTRAVENOUS | Status: DC | PRN
Start: 2024-10-14 — End: 2024-10-14
  Administered 2024-10-14 (×2): 80 ug via INTRAVENOUS

## 2024-10-14 MED ORDER — PHENYLEPHRINE 80 MCG/ML (10ML) SYRINGE FOR IV PUSH (FOR BLOOD PRESSURE SUPPORT)
PREFILLED_SYRINGE | INTRAVENOUS | Status: AC
  Filled 2024-10-14: qty 10

## 2024-10-14 MED ORDER — LIDOCAINE HCL (CARDIAC) PF 100 MG/5ML IV SOSY
PREFILLED_SYRINGE | INTRAVENOUS | Status: DC | PRN
  Administered 2024-10-14: 60 mg via INTRAVENOUS

## 2024-10-14 MED ORDER — OXYCODONE HCL 5 MG PO TABS: 5.0000 mg | ORAL_TABLET | Freq: Once | ORAL | Status: DC | PRN

## 2024-10-14 MED ORDER — ROCURONIUM BROMIDE 100 MG/10ML IV SOLN
INTRAVENOUS | Status: DC | PRN
  Administered 2024-10-14: 50 mg via INTRAVENOUS

## 2024-10-14 MED ORDER — DEXAMETHASONE SODIUM PHOSPHATE 4 MG/ML IJ SOLN
INTRAMUSCULAR | Status: DC | PRN
  Administered 2024-10-14: 5 mg via INTRAVENOUS

## 2024-10-14 MED ORDER — ONDANSETRON HCL 4 MG/2ML IJ SOLN
INTRAMUSCULAR | Status: AC
  Filled 2024-10-14: qty 2

## 2024-10-14 MED ORDER — LIDOCAINE-EPINEPHRINE 1 %-1:100000 IJ SOLN
INTRAMUSCULAR | Status: AC
  Filled 2024-10-14: qty 1

## 2024-10-14 MED ORDER — LIDOCAINE 2% (20 MG/ML) 5 ML SYRINGE
INTRAMUSCULAR | Status: AC
  Filled 2024-10-14: qty 5

## 2024-10-14 MED ORDER — FENTANYL CITRATE (PF) 100 MCG/2ML IJ SOLN
INTRAMUSCULAR | Status: DC | PRN
  Administered 2024-10-14: 50 ug via INTRAVENOUS
  Administered 2024-10-14 (×2): 25 ug via INTRAVENOUS

## 2024-10-14 MED ORDER — LACTATED RINGERS IV SOLN: INTRAVENOUS | Status: DC

## 2024-10-14 MED ORDER — GLYCOPYRROLATE PF 0.2 MG/ML IJ SOSY
PREFILLED_SYRINGE | INTRAMUSCULAR | Status: AC
  Filled 2024-10-14: qty 1

## 2024-10-14 SURGICAL SUPPLY — 24 items
BLADE SURG 15 STRL LF DISP TIS (BLADE) ×2 IMPLANT
CANISTER SUCT 1200ML W/VALVE (MISCELLANEOUS) ×2 IMPLANT
COVER MAYO STAND STRL (DRAPES) ×2 IMPLANT
ELECTRODE REM PT RTRN 9FT ADLT (ELECTROSURGICAL) IMPLANT
GLOVE BIOGEL PI IND STRL 6.5 (GLOVE) IMPLANT
GLOVE SS BIOGEL STRL SZ 7.5 (GLOVE) ×2 IMPLANT
GLOVE SURG SS PI 6.0 STRL IVOR (GLOVE) IMPLANT
GOWN STRL REUS W/ TWL LRG LVL3 (GOWN DISPOSABLE) ×2 IMPLANT
MARKER SKIN DUAL TIP RULER LAB (MISCELLANEOUS) IMPLANT
NDL HYPO 22X1.5 SAFETY MO (MISCELLANEOUS) IMPLANT
NDL PRECISIONGLIDE 27X1.5 (NEEDLE) IMPLANT
NEEDLE HYPO 22X1.5 SAFETY MO (MISCELLANEOUS) ×1 IMPLANT
NEEDLE PRECISIONGLIDE 27X1.5 (NEEDLE) IMPLANT
PACK BASIN DAY SURGERY FS (CUSTOM PROCEDURE TRAY) ×2 IMPLANT
PENCIL SMOKE EVACUATOR (MISCELLANEOUS) IMPLANT
SCISSORS WIRE ANG 4 3/4 DISP (INSTRUMENTS) IMPLANT
SHEET MEDIUM DRAPE 40X70 STRL (DRAPES) ×2 IMPLANT
SPIKE FLUID TRANSFER (MISCELLANEOUS) ×2 IMPLANT
SUT CHROMIC 3 0 PS 2 (SUTURE) IMPLANT
SYR 20ML LL LF (SYRINGE) IMPLANT
SYR CONTROL 10ML LL (SYRINGE) IMPLANT
TOWEL GREEN STERILE FF (TOWEL DISPOSABLE) ×4 IMPLANT
TUBE CONNECTING 20X1/4 (TUBING) ×2 IMPLANT
YANKAUER SUCT BULB TIP NO VENT (SUCTIONS) ×2 IMPLANT

## 2024-10-14 NOTE — Anesthesia Preprocedure Evaluation (Addendum)
 Anesthesia Evaluation  Patient identified by MRN, date of birth, ID band Patient awake    Reviewed: Allergy & Precautions, NPO status , Patient's Chart, lab work & pertinent test results, reviewed documented beta blocker date and time   History of Anesthesia Complications Negative for: history of anesthetic complications  Airway Mallampati: IV  TM Distance: >3 FB Neck ROM: Full  Mouth opening: Limited Mouth Opening  Dental  (+) Dental Advisory Given, Teeth Intact   Pulmonary asthma    Pulmonary exam normal        Cardiovascular hypertension, Pt. on medications and Pt. on home beta blockers Normal cardiovascular exam     Neuro/Psych  Headaches  Neuromuscular disease  negative psych ROS   GI/Hepatic Neg liver ROS,GERD  Controlled,,  Endo/Other   Pre-DM   Renal/GU CRFRenal disease     Musculoskeletal  (+)  Fibromyalgia -  Abdominal   Peds  Hematology negative hematology ROS (+)   Anesthesia Other Findings SLE  Reproductive/Obstetrics                              Anesthesia Physical Anesthesia Plan  ASA: 3  Anesthesia Plan: General   Post-op Pain Management: Minimal or no pain anticipated   Induction:   PONV Risk Score and Plan: 3 and Treatment may vary due to age or medical condition, TIVA and Ondansetron   Airway Management Planned: Oral ETT  Additional Equipment: None  Intra-op Plan:   Post-operative Plan:   Informed Consent: I have reviewed the patients History and Physical, chart, labs and discussed the procedure including the risks, benefits and alternatives for the proposed anesthesia with the patient or authorized representative who has indicated his/her understanding and acceptance.       Plan Discussed with: CRNA, Anesthesiologist and Surgeon  Anesthesia Plan Comments:          Anesthesia Quick Evaluation

## 2024-10-14 NOTE — Anesthesia Postprocedure Evaluation (Signed)
 Anesthesia Post Note  Patient: Tracie Barnes  Procedure(s) Performed: REMOVAL, HARDWARE, MANDIBLE (Mouth)     Patient location during evaluation: PACU Anesthesia Type: General Level of consciousness: awake and alert Pain management: pain level controlled Vital Signs Assessment: post-procedure vital signs reviewed and stable Respiratory status: spontaneous breathing, nonlabored ventilation and respiratory function stable Cardiovascular status: stable and blood pressure returned to baseline Anesthetic complications: no   No notable events documented.  Last Vitals:  Vitals:   10/14/24 1200 10/14/24 1217  BP: 135/82 (!) 145/88  Pulse: 82 89  Resp: 12 16  Temp:  (!) 36.2 C  SpO2: 100% 100%    Last Pain:  Vitals:   10/14/24 1217  TempSrc: Temporal  PainSc: 2                  Debby FORBES Like

## 2024-10-14 NOTE — Interval H&P Note (Signed)
 History and Physical Interval Note:  10/14/2024 10:00 AM  Tracie Barnes  has presented today for surgery, with the diagnosis of Closed fracture of left side of mandibular body with routine healing.  The various methods of treatment have been discussed with the patient and family. After consideration of risks, benefits and other options for treatment, the patient has consented to  Procedure(s): REMOVAL, HARDWARE, MANDIBLE (N/A) as a surgical intervention.  The patient's history has been reviewed, patient examined, no change in status, stable for surgery.  I have reviewed the patient's chart and labs.  Questions were answered to the patient's satisfaction.     I saw her yesterday and she has continued inflammation on the gingiva and pain in the plate area. She does not seem to have any instability of the bone.  Once the plates are exposed I have intrusted her that more plating may be necessary if the bone is not healing.  Norleen Notice

## 2024-10-14 NOTE — Transfer of Care (Signed)
 Immediate Anesthesia Transfer of Care Note  Patient: Tracie Barnes  Procedure(s) Performed: REMOVAL, HARDWARE, MANDIBLE (Mouth)  Patient Location: PACU  Anesthesia Type:General  Level of Consciousness: drowsy  Airway & Oxygen Therapy: Patient Spontanous Breathing and Patient connected to face mask oxygen  Post-op Assessment: Report given to RN and Post -op Vital signs reviewed and stable  Post vital signs: Reviewed and stable  Last Vitals:  Vitals Value Taken Time  BP 133/85 (101)   Temp    Pulse 79 10/14/24 11:52  Resp 11   SpO2 100 % 10/14/24 11:52  Vitals shown include unfiled device data.  Last Pain:  Vitals:   10/14/24 1027  PainSc: 4       Patients Stated Pain Goal: 5 (10/14/24 1027)  Complications: No notable events documented.

## 2024-10-14 NOTE — Op Note (Signed)
 Preop/postop diagnosis: Retained hardware Procedure: Removal of mandibular hardware Anesthesia General Estimated blood loss approximately 25 cc Indications: 73 year old is 2 months out from mandible fracture and seems to have good healing with solid bone and good occlusion.  She continues to have drainage and granulation tissue from the incision site and a point tenderness where the plate is from the external approach.  She seems now to need removal of the foreign body especially given her immune system.  She was informed the risk and benefits of the procedure and options were discussed all questions were answered and consent was obtained. Procedure: Patient was taken the op room placed supine position after general endotracheal tube anesthesia was placed in the supine position and draped in usual sterile manner.  The gingivolabial sulcus was injected with 1% lidocaine  with 1 100,000 epinephrine .  An incision was made with electrocautery in the previous location.  The granulation tissue and 2 sites was removed the wound was open there was scarring around the upper portion of the mandible and that was around the alveolar ridge plate.  That was freed up and the 4 screws of that plate were removed and the plate removed.  Dissection was carried inferior where the banding plate was identified which was superior to the inferior alveolar nerve.  This 4-hole plate was removed with the screws as well.  Dissection was then carried out below the nerve identifying and preserving the nerve.  All 4 screws of the inferior plate were removed and the plate as well.  Culture was taken from the location.  The bone looks solid and healed.  The wound was copiously irrigated and closed loosely with a 3-0 chromic.  Patient was awakened brought to recovery in stable condition counts correct

## 2024-10-14 NOTE — Discharge Instructions (Signed)

## 2024-10-14 NOTE — Anesthesia Procedure Notes (Signed)
 Procedure Name: Intubation Date/Time: 10/14/2024 10:59 AM  Performed by: Pam Macario BROCKS, CRNAPre-anesthesia Checklist: Patient identified, Emergency Drugs available, Suction available, Patient being monitored and Timeout performed Patient Re-evaluated:Patient Re-evaluated prior to induction Oxygen Delivery Method: Circle system utilized Preoxygenation: Pre-oxygenation with 100% oxygen Induction Type: IV induction Ventilation: Mask ventilation without difficulty Laryngoscope Size: Mac and 3 Grade View: Grade II Tube type: Oral Tube size: 7.0 mm Number of attempts: 1 Airway Equipment and Method: Stylet and Oral airway Placement Confirmation: ETT inserted through vocal cords under direct vision, positive ETCO2, breath sounds checked- equal and bilateral and CO2 detector Secured at: 23 cm Tube secured with: Tape Dental Injury: Teeth and Oropharynx as per pre-operative assessment

## 2024-10-15 ENCOUNTER — Encounter (HOSPITAL_BASED_OUTPATIENT_CLINIC_OR_DEPARTMENT_OTHER): Payer: Self-pay | Admitting: Otolaryngology

## 2024-10-16 LAB — AEROBIC/ANAEROBIC CULTURE W GRAM STAIN (SURGICAL/DEEP WOUND)
Culture: NORMAL
Gram Stain: NONE SEEN

## 2024-10-21 ENCOUNTER — Ambulatory Visit (INDEPENDENT_AMBULATORY_CARE_PROVIDER_SITE_OTHER): Admitting: Physician Assistant

## 2024-10-21 ENCOUNTER — Encounter (INDEPENDENT_AMBULATORY_CARE_PROVIDER_SITE_OTHER): Payer: Self-pay | Admitting: Physician Assistant

## 2024-10-21 VITALS — BP 104/65 | HR 71

## 2024-10-21 DIAGNOSIS — S02602D Fracture of unspecified part of body of left mandible, subsequent encounter for fracture with routine healing: Secondary | ICD-10-CM

## 2024-10-21 DIAGNOSIS — Z9889 Other specified postprocedural states: Secondary | ICD-10-CM

## 2024-10-21 NOTE — Progress Notes (Signed)
 Dear Dr. Nicoletta, Here is my assessment for our mutual patient, Tracie Barnes. Thank you for allowing me the opportunity to care for your patient. Please do not hesitate to contact me should you have any other questions. Sincerely, Chyrl Cohen PA-C  Otolaryngology Clinic Note Referring provider: Dr. Nicoletta HPI:  Tracie Barnes is a 73 y.o. female kindly referred by Dr. Nicoletta   The patient is a 73 year old female seen in our office for follow-up evaluation status post open reduction internal fixation with MMF of mandibular fracture by Dr. Roark on 08/16/2024.   Patient struggled with swelling along the fracture site on the left postoperatively she had been on multiple rounds of antibiotics.  Patient was back to the OR on 10/14/2024 where she had hardware removed.  The area was cultured which showed no growth.  She notes that since surgery she has been doing much better, she notes the swelling has steadily improved, she denies any fever.  She does note some ongoing discomfort along the left jaw she reports this is worse with riding in the car but improved prior to surgery.  Overall she feels things are moving in the right direction, she notes that her occlusion is not perfect but feels like it is improving as well.  She has no significant concerns at today's visit.   Independent Review of Additional Tests or Records:  Op note on 10/14/2024   PMH/Meds/All/SocHx/FamHx/ROS:   Past Medical History:  Diagnosis Date   Allergy    Asthma    Chronic kidney disease    Dyspnea    Eczema    Fluid retention    Hyperlipidemia    Hypertension    Pre-diabetes    Systemic lupus erythematosus (HCC)    Unspecified vitamin D deficiency    Urticaria      Past Surgical History:  Procedure Laterality Date   COLONOSCOPY     COLONOSCOPY WITH PROPOFOL  N/A 06/30/2019   Procedure: COLONOSCOPY WITH PROPOFOL ;  Surgeon: Therisa Bi, MD;  Location: [facility] ENDOSCOPY;  Service: Gastroenterology;  Laterality: N/A;    ESOPHAGOGASTRODUODENOSCOPY     MANDIBULAR HARDWARE REMOVAL N/A 10/14/2024   Procedure: REMOVAL, HARDWARE, MANDIBLE;  Surgeon: Roark Rush, MD;  Location: [facility];  Service: ENT;  Laterality: N/A;   ORIF MANDIBULAR FRACTURE N/A 08/16/2024   Procedure: OPEN REDUCTION INTERNAL FIXATION (ORIF) MANDIBULAR FRACTURE;  Surgeon: Roark Rush, MD;  Location: [facility] OR;  Service: ENT;  Laterality: N/A;  AND MMF SCREWS    Family History  Problem Relation Age of Onset   Hypertension Father      Social Connections: Not on file      Current Outpatient Medications:    ACIDOPHILUS LACTOBACILLUS PO, Take 2 tablets by mouth daily., Disp: , Rfl:    albuterol  (PROVENTIL ) (2.5 MG/3ML) 0.083% nebulizer solution, Take 3 mLs (2.5 mg total) by nebulization every 4 (four) hours as needed for wheezing or shortness of breath., Disp: 75 mL, Rfl: 1   albuterol  (VENTOLIN  HFA) 108 (90 Base) MCG/ACT inhaler, Inhale 2 puffs into the lungs every 4 (four) hours as needed for wheezing or shortness of breath., Disp: 1 each, Rfl: 5   azelastine  (ASTELIN ) 0.1 % nasal spray, 1 spray per nostril 1-2 times daily as needed., Disp: 30 mL, Rfl: 5   cetirizine  (ZYRTEC ) 10 MG tablet, Take 1 tablet (10 mg total) by mouth daily., Disp: 30 tablet, Rfl: 5   Cholecalciferol 10000 units TABS, Take 1,000 Units by mouth daily., Disp: , Rfl:  clindamycin  (CLEOCIN ) 300 MG capsule, Take 1 capsule (300 mg total) by mouth 3 (three) times daily., Disp: 21 capsule, Rfl: 0   clobetasol (TEMOVATE) 0.05 % external solution, APPLY SMALL AMOUNT TO AFFECTED AREA DAILY AS NEEDED - RINSE IN SCALP. NEVER TO FACE., Disp: , Rfl:    fluocinonide (LIDEX) 0.05 % external solution, APPLY SMALL AMOUNT TO AFFECTED AREA DAILY AS NEEDED (Patient not taking: Reported on 09/29/2024), Disp: , Rfl:    fluticasone  (FLONASE ) 50 MCG/ACT nasal spray, Place 1 spray into both nostrils daily., Disp: 16 g, Rfl: 5   fluticasone -salmeterol (ADVAIR  HFA) 115-21 MCG/ACT  inhaler, Inhale 2 puffs into the lungs 2 (two) times daily., Disp: 1 each, Rfl: 12   hydroxychloroquine (PLAQUENIL) 200 MG tablet, Take by mouth daily., Disp: , Rfl:    hydrOXYzine  (VISTARIL ) 25 MG capsule, Take 1-2 tablets nightly, Disp: 60 capsule, Rfl: 5   ipratropium-albuterol  (DUONEB) 0.5-2.5 (3) MG/3ML SOLN, Take 3 mLs by nebulization every 4 (four) hours as needed., Disp: 360 mL, Rfl: 3   ketotifen (ZADITOR) 0.025 % ophthalmic solution, INSTILL 1 DROP IN BOTH EYES TWICE A DAY AS NEEDED, Disp: , Rfl:    metoprolol  tartrate (LOPRESSOR ) 25 MG tablet, Take 0.5 tablets (12.5 mg total) by mouth daily., Disp: 45 tablet, Rfl: 1   montelukast  (SINGULAIR ) 10 MG tablet, Take 1 tablet (10 mg total) by mouth at bedtime., Disp: 30 tablet, Rfl: 5   Nebulizer System All-In-One MISC, 1 Device by Does not apply route as needed., Disp: 1 each, Rfl: 0   Omega-3 Fatty Acids (FISH OIL) 1000 MG CAPS, Take 1 capsule by mouth daily., Disp: , Rfl:    pantoprazole (PROTONIX) 40 MG tablet, Take 40 mg by mouth 2 (two) times daily., Disp: , Rfl:    Respiratory Therapy Supplies (NEBULIZER/TUBING/MOUTHPIECE) KIT, Use as directed. (Patient not taking: Reported on 09/29/2024), Disp: 1 kit, Rfl: 0   triamterene -hydrochlorothiazide (MAXZIDE-25) 37.5-25 MG tablet, Take 0.5 tablets by mouth daily., Disp: 45 tablet, Rfl: 1   vitamin E 400 UNIT capsule, Take 800 Units by mouth daily. , Disp: , Rfl:    Physical Exam:   BP 104/65   Pulse 71   SpO2 98%   Pertinent Findings  CN II-XII intact-face symmetric Jaw symmetric, minimal nodule of the left jaw, no fluctuance Mucosal incision clean dry and intact with some granular tissue, no dehiscence, no obvious bone, no drainage Neck is supple full active range of motion with no swelling Respirations are unlabored   Seprately Identifiable Procedures:  None\  Impression & Plans:  Tracie Barnes is a 73 y.o. female with the following   Status post open reduction internal fixation  with MMF of left mandibular fracture on 08/16/2024 with removal of hardware on 10/14/2024-   Patient is doing much better today, she feels like she is progressing.  No signs of infection, no wound issues.  Patient continues to do soft diet with some ongoing discomfort although this is improving.  At this point the patient may follow-up with us  on a as needed basis if she develops any new or worsening signs or symptoms in the future.  The patient is very happy with today's plan and had no further questions or concerns at today's visit.   - f/u RN   Thank you for allowing me the opportunity to care for your patient. Please do not hesitate to contact me should you have any other questions.  Sincerely, Chyrl Cohen PA-C [facility] Phone: [phone] Fax: [phone]  10/21/2024, 12:10 PM

## 2025-01-05 ENCOUNTER — Ambulatory Visit (INDEPENDENT_AMBULATORY_CARE_PROVIDER_SITE_OTHER)

## 2025-01-05 DIAGNOSIS — J302 Other seasonal allergic rhinitis: Secondary | ICD-10-CM | POA: Diagnosis not present

## 2025-01-05 NOTE — Progress Notes (Signed)
"                                                                        Immunotherapy   Patient Details  Name: Alisyn Lequire MRN: [id number] Date of Birth: 04-Jan-1951  01/05/2025  Beverley Bennett Hales started injections for  [place] Following schedule: A  Frequency:1-2x weekly Epi-Pen:Epi-Pen Available  Consent signed and patient instructions given. Patient waited in office 30 minutes with no issues.    Isaiah LITTIE Shed 01/05/2025, 2:32 PM   "

## 2025-01-12 ENCOUNTER — Encounter: Payer: Self-pay | Admitting: Neurology

## 2025-01-12 ENCOUNTER — Ambulatory Visit: Admitting: Neurology

## 2025-01-12 VITALS — BP 143/79 | HR 73 | Ht 65.5 in | Wt 133.5 lb

## 2025-01-12 DIAGNOSIS — R251 Tremor, unspecified: Secondary | ICD-10-CM | POA: Insufficient documentation

## 2025-01-12 DIAGNOSIS — G43709 Chronic migraine without aura, not intractable, without status migrainosus: Secondary | ICD-10-CM

## 2025-01-12 MED ORDER — MIRTAZAPINE 15 MG PO TABS: 7.5000 mg | ORAL_TABLET | Freq: Every day | ORAL | 3 refills | Status: AC

## 2025-01-12 MED ORDER — GABAPENTIN 100 MG PO CAPS: 300.0000 mg | ORAL_CAPSULE | Freq: Every evening | ORAL | 6 refills | Status: AC | PRN

## 2025-01-12 MED ORDER — MIRTAZAPINE 15 MG PO TABS: 7.5000 mg | ORAL_TABLET | Freq: Every day | ORAL | 3 refills | Status: DC

## 2025-01-12 MED ORDER — BUTALBITAL-APAP-CAFFEINE 50-325-40 MG PO TABS: 1.0000 | ORAL_TABLET | Freq: Four times a day (QID) | ORAL | 5 refills | Status: AC | PRN

## 2025-01-12 MED ORDER — BUTALBITAL-APAP-CAFFEINE 50-325-40 MG PO TABS: 1.0000 | ORAL_TABLET | Freq: Four times a day (QID) | ORAL | 5 refills | Status: DC | PRN

## 2025-01-12 MED ORDER — GABAPENTIN 100 MG PO CAPS: 300.0000 mg | ORAL_CAPSULE | Freq: Every evening | ORAL | 6 refills | Status: DC | PRN

## 2025-01-12 NOTE — Progress Notes (Signed)
 "  Chief Complaint  Patient presents with   New Patient (Initial Visit)    Pt in room 14. Alone. Internal referral for migraines.      ASSESSMENT AND PLAN  Tracie Barnes is a 74 y.o. female   Chronic migraine Difficulty sleeping  Was given prescription of Remeron , went over potential side effect indication for treatment, encourage patient to try starting at low-dose 7.5 mg daily, titrating to 50 mg every night if needed  Gabapentin  100 mg 1 to 3 tablets as needed for lower extremity muscle spasm  Fioricet as needed for migraine  Return to clinic in 9 to 12 months   If she is doing well, may discharge to primary care  DIAGNOSTIC DATA (LABS, IMAGING, TESTING) - I reviewed patient records, labs, notes, testing and imaging myself where available.   MEDICAL HISTORY:  Tracie Barnes, is a 74 year old female seen in request by her primary care from [name], [name] for evaluation of intermittent headache, lower extremity tremor  History is obtained from the patient and review of electronic medical records. I personally reviewed pertinent available imaging films in PACS.   PMHx of  HTN Asthma Lupus Mandibular fracture in August 2015.  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 who suffered Lewy body dementia, which caused some stress on her, did not stop nortriptyline , she noticed difficulty sleeping, whole body tremorish sensation, most noticeable at nighttime lying in bed trying to sleep, less bothersome during the day  She was giving the  prescription of Remeron , read the potential side effect she did not try it yet  She fell in August 2025, get up around 4 AM to help her husband, then woke up on the floor in a puddle of blood, had mandibular fracture, required surgical wire, now improved  She had worsening lower extremity achy pain, tremors sensation difficulty sleeping following that episode, only recently she began to bounce back  Personally reviewed CT maxillary without contrast, communicated nondisplaced fracture of the anterior mandible with extension through the roots of the left first premolar, medial and lateral incision and the left canine tooth  CT head no acute abnormality  PHYSICAL EXAM:   Vitals:   01/12/25 1258  BP: (!) 143/79  Pulse: 73  Weight: 133 lb 8 oz (60.6 kg)  Height: 5' 5.5 (1.664 m)   Body mass index is 21.88 kg/m.  PHYSICAL EXAMNIATION:  Gen: NAD, conversant, well nourised, well groomed                     Cardiovascular: Regular rate rhythm, no peripheral edema, warm, nontender. Eyes: Conjunctivae clear without exudates or hemorrhage Neck: Supple, no carotid bruits. Pulmonary: Clear to auscultation bilaterally   NEUROLOGICAL EXAM:  MENTAL STATUS: Speech/cognition: Awake, alert, oriented to history taking and casual conversation CRANIAL NERVES: CN II: Visual fields are full to confrontation. Pupils are round equal and briskly reactive to light. CN III, IV, VI: extraocular movement are normal. No ptosis. CN V: Facial sensation is intact to light touch CN VII: Face is symmetric with normal eye closure  CN VIII:  Hearing is normal to causal conversation. CN IX, X: Phonation is normal. CN XI: Head turning and shoulder shrug are intact  MOTOR: There is no pronator drift of out-stretched arms. Muscle bulk and tone are normal. Muscle strength is normal.  REFLEXES: Reflexes are 2+ and symmetric at the biceps, triceps, knees, and ankles. Plantar responses are  flexor.  SENSORY: Intact to light touch, pinprick and vibratory sensation are intact in fingers and toes.  COORDINATION: There is no trunk or limb dysmetria noted.  GAIT/STANCE: Posture is normal. Gait is steady   REVIEW OF SYSTEMS:  Full 14 system review of systems performed and notable only for as above All other review of systems were negative.   ALLERGIES: Allergies[1]  HOME MEDICATIONS: Current Outpatient Medications  Medication Sig Dispense Refill   albuterol  (PROVENTIL ) (2.5 MG/3ML) 0.083% nebulizer solution Take 3 mLs (2.5 mg total) by nebulization every 4 (four) hours as needed for wheezing or shortness of breath. 75 mL 1   albuterol  (VENTOLIN  HFA) 108 (90 Base) MCG/ACT inhaler Inhale 2 puffs into the lungs every 4 (four) hours as needed for wheezing or shortness of breath. 1 each 5   azelastine  (ASTELIN ) 0.1 % nasal spray 1 spray per nostril 1-2 times daily as needed. 30 mL 5   cetirizine  (ZYRTEC ) 10 MG tablet Take 1 tablet (10 mg total) by mouth daily. 30 tablet 5   Cholecalciferol 10000 units TABS Take 1,000 Units by mouth daily.     clobetasol (TEMOVATE) 0.05 % external solution APPLY SMALL AMOUNT TO AFFECTED AREA DAILY AS NEEDED - RINSE IN SCALP. NEVER TO FACE.     fluocinonide (LIDEX) 0.05 % external solution APPLY SMALL AMOUNT TO AFFECTED AREA DAILY AS NEEDED     fluticasone  (FLONASE ) 50 MCG/ACT nasal spray Place 1 spray into both nostrils daily. 16 g 5   fluticasone -salmeterol (ADVAIR  HFA) 115-21 MCG/ACT inhaler Inhale 2 puffs into the lungs 2 (two) times daily. 1 each 12   hydroxychloroquine (PLAQUENIL) 200 MG tablet Take by mouth daily.     ipratropium-albuterol  (DUONEB) 0.5-2.5 (3) MG/3ML SOLN Take 3 mLs by nebulization every 4 (four) hours as needed. 360 mL 3   ketotifen (ZADITOR) 0.025 % ophthalmic solution INSTILL 1 DROP IN BOTH EYES TWICE A DAY AS NEEDED     metoprolol  tartrate (LOPRESSOR ) 25 MG tablet Take 0.5 tablets (12.5 mg total) by mouth daily. 45  tablet 1   montelukast  (SINGULAIR ) 10 MG tablet Take 1 tablet (10 mg total) by mouth at bedtime. 30 tablet 5   Nebulizer System All-In-One MISC 1 Device by Does not apply route as needed. 1 each 0   Omega-3 Fatty Acids (FISH OIL) 1000 MG CAPS Take 1 capsule by mouth daily.     pantoprazole (PROTONIX) 40 MG tablet Take 40 mg by mouth 2 (two) times daily.     triamterene -hydrochlorothiazide (MAXZIDE-25) 37.5-25 MG tablet Take 0.5 tablets by mouth daily. 45 tablet 1   vitamin E 400 UNIT capsule Take 800 Units by mouth daily.      ACIDOPHILUS LACTOBACILLUS PO Take 2 tablets by mouth daily.     clindamycin  (CLEOCIN ) 300 MG capsule Take 1 capsule (300 mg total) by mouth 3 (three) times daily. (Patient not taking: Reported on 01/12/2025) 21 capsule 0   hydrOXYzine  (VISTARIL ) 25 MG capsule Take 1-2 tablets nightly 60 capsule 5   mirtazapine  (REMERON ) 15 MG tablet Take 7.5 mg by mouth at bedtime. (Patient not taking: Reported on 01/12/2025)     Respiratory Therapy Supplies (  NEBULIZER/TUBING/MOUTHPIECE) KIT Use as directed. (Patient not taking: Reported on 09/29/2024) 1 kit 0   No current facility-administered medications for this visit.    PAST MEDICAL HISTORY: Past Medical History:  Diagnosis Date   Allergy    Asthma    Chronic kidney disease    Dyspnea    Eczema    Fluid retention    Hyperlipidemia    Hypertension    Pre-diabetes    Systemic lupus erythematosus (HCC)    Unspecified vitamin D deficiency    Urticaria     PAST SURGICAL HISTORY: Past Surgical History:  Procedure Laterality Date   COLONOSCOPY     COLONOSCOPY WITH PROPOFOL  N/A 06/30/2019   Procedure: COLONOSCOPY WITH PROPOFOL ;  Surgeon: Therisa Bi, MD;  Location: [facility]COPY;  Service: Gastroenterology;  Laterality: N/A;   ESOPHAGOGASTRODUODENOSCOPY     MANDIBULAR HARDWARE REMOVAL N/A 10/14/2024   Procedure: REMOVAL, HARDWARE, MANDIBLE;  Surgeon: Roark Rush, MD;  Location: [facility];  Service: ENT;   Laterality: N/A;   ORIF MANDIBULAR FRACTURE N/A 08/16/2024   Procedure: OPEN REDUCTION INTERNAL FIXATION (ORIF) MANDIBULAR FRACTURE;  Surgeon: Roark Rush, MD;  Location: [facility] OR;  Service: ENT;  Laterality: N/A;  AND MMF SCREWS    FAMILY HISTORY: Family History  Problem Relation Age of Onset   Hypertension Father     SOCIAL HISTORY: Social History   Socioeconomic History   Marital status: Married    Spouse name: Not on file   Number of children: Not on file   Years of education: Not on file   Highest education level: Not on file  Occupational History   Not on file  Tobacco Use   Smoking status: Never   Smokeless tobacco: Never  Vaping Use   Vaping status: Never Used  Substance and Sexual Activity   Alcohol use: No   Drug use: No   Sexual activity: Not Currently    Birth control/protection: Post-menopausal  Other Topics Concern   Not on file  Social History Narrative   Patient is single, has 3 children   Patient is right handed   Education level is Master's degree   Caffeine  consumption is 0   Social Drivers of Health   Tobacco Use: Low Risk (01/12/2025)   Patient History    Smoking Tobacco Use: Never    Smokeless Tobacco Use: Never    Passive Exposure: Not on file  Financial Resource Strain: Not on file  Food Insecurity: Not on file  Transportation Needs: Not on file  Physical Activity: Not on file  Stress: Not on file  Social Connections: Not on file  Intimate Partner Violence: Not on file  Depression (EYV7-0): Not on file  Alcohol Screen: Not on file  Housing: Not on file  Utilities: Not on file  Health Literacy: Not on file      Modena Callander, M.D. Ph.D.  [facility] [address] [place], [place] [address] Ph: [phone] Fax: [phone]  CC:  Iva [name], MD [address] [place] [address] [place],  [place] [address]  Clinic, [facility]       [1]  Allergies Allergen Reactions   Methocarbamol Other (See  Comments)    fainted   Mycophenolate Mofetil     Other Reaction(s): Incontinence of feces   Prednisone Other (See Comments)    Pt does not remember reaction    Sulfa Antibiotics Hives and Other (See Comments)   Amoxicillin Rash and Dermatitis   Ampicillin  Other (See Comments) and Dermatitis    Pt had blisters all over her body  Has patient had a PCN reaction causing immediate rash, facial/tongue/throat swelling, SOB or lightheadedness with hypotension: no  Has patient had a PCN reaction causing severe rash involving mucus membranes or skin necrosis: yes  Has patient had a PCN reaction that required hospitalization: no  Has patient had a PCN reaction occurring within the last 10 years: no  If all of the above answers are NO, then may proceed with Cephalosporin use.  Pt had blisters all over her body  Has patient had a PCN reaction causing immediate rash, facial/tongue/throat swelling, SOB or lightheadedness with hypotension: no  Has patient had a PCN reaction causing severe rash involving mucus membranes or skin necrosis: yes  Has patient had a PCN reaction that required hospitalization: no  Has patient had a PCN reaction occurring within the last 10 years: no  If all of the above answers are NO, then may proceed with Cephalosporin use.  Pt had blisters all over her body  Has patient had a PCN reaction causing immediate rash, facial/tongue/throat swelling, SOB or lightheadedness with hypotension: no  Has patient had a PCN reaction causing severe rash involving mucus membranes or skin necrosis: yes  Has patient had a PCN reaction that required hospitalization: no  Has patient had a PCN reaction occurring within the last 10 years: no  If all of the above answers are NO, then may proceed with Cephalosporin use.  Pt had blisters all over her body  Has patient had a PCN reaction causing immediate rash, facial/tongue/throat swelling, SOB or lightheadedness with hypotension: no  Has  patient had a PCN reaction causing severe rash involving mucus membranes or skin necrosis: yes  Has patient had a PCN reaction that required hospitalization: no  Has patient had a PCN reaction occurring within the last 10 years: no  If all of the above answers are NO, then may proceed with Cephalosporin use.  Pt had blisters all over her body  Has patient had a PCN reaction ca... (TRUNCATED)  Pt had blisters all over her body, Has patient had a PCN reaction causing immediate rash, facial/tongue/throat swelling, SOB or lightheadedness with hypotension: no, Has patient had a PCN reaction causing severe rash involving mucus membranes or skin necrosis: yes, Has patient had a PCN reaction that required hospitalization: no, Has patient had a PCN reaction occurring within the last 10 years: no, If all of the above answers are NO, then may proceed with Cephalosporin use.   Minocycline Rash and Dermatitis   "

## 2025-01-12 NOTE — Patient Instructions (Signed)
 Meds ordered this encounter  Medications   mirtazapine  (REMERON ) 15 MG tablet    Sig: Take 0.5 tablets (7.5 mg total) by mouth at bedtime.    Dispense:  45 tablet    Refill:  3   butalbital -acetaminophen -caffeine  (FIORICET) 50-325-40 MG tablet    Sig: Take 1 tablet by mouth every 6 (six) hours as needed for headache.    Dispense:  12 tablet    Refill:  5   gabapentin  (NEURONTIN ) 100 MG capsule    Sig: Take 3 capsules (300 mg total) by mouth at bedtime as needed.    Dispense:  90 capsule    Refill:  6

## 2025-01-18 ENCOUNTER — Telehealth: Payer: Self-pay | Admitting: *Deleted

## 2025-01-18 NOTE — Telephone Encounter (Signed)
 Dr.Yan please see the below message from [place].

## 2025-03-16 ENCOUNTER — Ambulatory Visit: Admitting: Allergy & Immunology

## 2025-10-12 ENCOUNTER — Ambulatory Visit: Admitting: Neurology
